# Patient Record
Sex: Female | Born: 1935
Health system: Southern US, Community
[De-identification: ages and names within clinical notes are randomized; demographics above are authoritative.]

## PROBLEM LIST (undated history)

## (undated) DIAGNOSIS — F0390 Unspecified dementia without behavioral disturbance: Secondary | ICD-10-CM

## (undated) DIAGNOSIS — E039 Hypothyroidism, unspecified: Secondary | ICD-10-CM

## (undated) HISTORY — PX: CHOLECYSTECTOMY: SHX55

## (undated) HISTORY — PX: ABDOMINAL HYSTERECTOMY: SHX81

---

## 2000-12-12 ENCOUNTER — Encounter: Admission: RE | Admit: 2000-12-12 | Discharge: 2000-12-12 | Payer: Self-pay | Admitting: Otolaryngology

## 2000-12-12 ENCOUNTER — Encounter: Payer: Self-pay | Admitting: Otolaryngology

## 2000-12-14 ENCOUNTER — Other Ambulatory Visit: Admission: RE | Admit: 2000-12-14 | Discharge: 2000-12-14 | Payer: Self-pay | Admitting: Otolaryngology

## 2001-05-27 ENCOUNTER — Encounter: Admission: RE | Admit: 2001-05-27 | Discharge: 2001-05-27 | Payer: Self-pay | Admitting: Otolaryngology

## 2001-05-27 ENCOUNTER — Encounter: Payer: Self-pay | Admitting: Otolaryngology

## 2002-12-11 ENCOUNTER — Encounter: Payer: Self-pay | Admitting: Otolaryngology

## 2002-12-11 ENCOUNTER — Ambulatory Visit (HOSPITAL_COMMUNITY): Admission: RE | Admit: 2002-12-11 | Discharge: 2002-12-11 | Payer: Self-pay | Admitting: Otolaryngology

## 2002-12-25 ENCOUNTER — Encounter (HOSPITAL_COMMUNITY): Admission: RE | Admit: 2002-12-25 | Discharge: 2003-01-24 | Payer: Self-pay | Admitting: Family Medicine

## 2002-12-26 ENCOUNTER — Encounter: Payer: Self-pay | Admitting: Family Medicine

## 2004-06-27 ENCOUNTER — Other Ambulatory Visit: Admission: RE | Admit: 2004-06-27 | Discharge: 2004-06-27 | Payer: Self-pay | Admitting: Internal Medicine

## 2005-01-21 ENCOUNTER — Emergency Department (HOSPITAL_COMMUNITY): Admission: EM | Admit: 2005-01-21 | Discharge: 2005-01-21 | Payer: Self-pay | Admitting: Emergency Medicine

## 2012-03-07 DIAGNOSIS — E039 Hypothyroidism, unspecified: Secondary | ICD-10-CM | POA: Diagnosis not present

## 2012-03-07 DIAGNOSIS — J209 Acute bronchitis, unspecified: Secondary | ICD-10-CM | POA: Diagnosis not present

## 2012-08-30 ENCOUNTER — Other Ambulatory Visit (HOSPITAL_COMMUNITY): Payer: Self-pay | Admitting: Internal Medicine

## 2012-08-30 DIAGNOSIS — J9801 Acute bronchospasm: Secondary | ICD-10-CM | POA: Diagnosis not present

## 2012-08-30 DIAGNOSIS — Z0000 Encounter for general adult medical examination without abnormal findings: Secondary | ICD-10-CM

## 2012-09-05 ENCOUNTER — Other Ambulatory Visit (HOSPITAL_COMMUNITY): Payer: Self-pay

## 2013-08-29 DIAGNOSIS — J310 Chronic rhinitis: Secondary | ICD-10-CM | POA: Diagnosis not present

## 2013-08-29 DIAGNOSIS — J019 Acute sinusitis, unspecified: Secondary | ICD-10-CM | POA: Diagnosis not present

## 2013-09-18 DIAGNOSIS — H18519 Endothelial corneal dystrophy, unspecified eye: Secondary | ICD-10-CM | POA: Diagnosis not present

## 2013-09-18 DIAGNOSIS — H35369 Drusen (degenerative) of macula, unspecified eye: Secondary | ICD-10-CM | POA: Diagnosis not present

## 2013-09-18 DIAGNOSIS — H524 Presbyopia: Secondary | ICD-10-CM | POA: Diagnosis not present

## 2013-09-18 DIAGNOSIS — H35039 Hypertensive retinopathy, unspecified eye: Secondary | ICD-10-CM | POA: Diagnosis not present

## 2013-09-18 DIAGNOSIS — H2510 Age-related nuclear cataract, unspecified eye: Secondary | ICD-10-CM | POA: Diagnosis not present

## 2013-10-07 DIAGNOSIS — H269 Unspecified cataract: Secondary | ICD-10-CM | POA: Diagnosis not present

## 2013-10-07 DIAGNOSIS — H2510 Age-related nuclear cataract, unspecified eye: Secondary | ICD-10-CM | POA: Diagnosis not present

## 2013-11-12 DIAGNOSIS — H25019 Cortical age-related cataract, unspecified eye: Secondary | ICD-10-CM | POA: Diagnosis not present

## 2013-11-12 DIAGNOSIS — H2510 Age-related nuclear cataract, unspecified eye: Secondary | ICD-10-CM | POA: Diagnosis not present

## 2013-11-25 DIAGNOSIS — H2510 Age-related nuclear cataract, unspecified eye: Secondary | ICD-10-CM | POA: Diagnosis not present

## 2013-11-25 DIAGNOSIS — H269 Unspecified cataract: Secondary | ICD-10-CM | POA: Diagnosis not present

## 2014-01-06 DIAGNOSIS — E040 Nontoxic diffuse goiter: Secondary | ICD-10-CM | POA: Diagnosis not present

## 2014-05-05 DIAGNOSIS — E040 Nontoxic diffuse goiter: Secondary | ICD-10-CM | POA: Diagnosis not present

## 2014-05-21 DIAGNOSIS — B354 Tinea corporis: Secondary | ICD-10-CM | POA: Diagnosis not present

## 2014-05-21 DIAGNOSIS — IMO0002 Reserved for concepts with insufficient information to code with codable children: Secondary | ICD-10-CM | POA: Diagnosis not present

## 2014-08-25 DIAGNOSIS — E039 Hypothyroidism, unspecified: Secondary | ICD-10-CM | POA: Diagnosis not present

## 2014-08-25 DIAGNOSIS — E049 Nontoxic goiter, unspecified: Secondary | ICD-10-CM | POA: Diagnosis not present

## 2014-08-26 DIAGNOSIS — Z961 Presence of intraocular lens: Secondary | ICD-10-CM | POA: Diagnosis not present

## 2014-10-15 DIAGNOSIS — B354 Tinea corporis: Secondary | ICD-10-CM | POA: Diagnosis not present

## 2014-10-15 DIAGNOSIS — Z6823 Body mass index (BMI) 23.0-23.9, adult: Secondary | ICD-10-CM | POA: Diagnosis not present

## 2014-11-06 DIAGNOSIS — Z6823 Body mass index (BMI) 23.0-23.9, adult: Secondary | ICD-10-CM | POA: Diagnosis not present

## 2014-11-06 DIAGNOSIS — B354 Tinea corporis: Secondary | ICD-10-CM | POA: Diagnosis not present

## 2014-12-22 DIAGNOSIS — E049 Nontoxic goiter, unspecified: Secondary | ICD-10-CM | POA: Diagnosis not present

## 2015-01-25 DIAGNOSIS — Z6823 Body mass index (BMI) 23.0-23.9, adult: Secondary | ICD-10-CM | POA: Diagnosis not present

## 2015-01-25 DIAGNOSIS — E039 Hypothyroidism, unspecified: Secondary | ICD-10-CM | POA: Diagnosis not present

## 2015-01-25 DIAGNOSIS — L259 Unspecified contact dermatitis, unspecified cause: Secondary | ICD-10-CM | POA: Diagnosis not present

## 2015-02-01 ENCOUNTER — Other Ambulatory Visit (HOSPITAL_COMMUNITY): Payer: Self-pay | Admitting: Internal Medicine

## 2015-02-01 DIAGNOSIS — E049 Nontoxic goiter, unspecified: Secondary | ICD-10-CM

## 2015-02-05 ENCOUNTER — Ambulatory Visit (HOSPITAL_COMMUNITY)
Admission: RE | Admit: 2015-02-05 | Discharge: 2015-02-05 | Disposition: A | Discharge status: Home / Self Care | Payer: Medicare Other | Source: Ambulatory Visit | Attending: Internal Medicine | Admitting: Internal Medicine

## 2015-02-05 DIAGNOSIS — E042 Nontoxic multinodular goiter: Secondary | ICD-10-CM | POA: Diagnosis not present

## 2015-02-05 DIAGNOSIS — E049 Nontoxic goiter, unspecified: Secondary | ICD-10-CM | POA: Diagnosis not present

## 2015-03-23 DIAGNOSIS — E049 Nontoxic goiter, unspecified: Secondary | ICD-10-CM | POA: Diagnosis not present

## 2015-03-23 DIAGNOSIS — E039 Hypothyroidism, unspecified: Secondary | ICD-10-CM | POA: Diagnosis not present

## 2015-03-25 DIAGNOSIS — L304 Erythema intertrigo: Secondary | ICD-10-CM | POA: Diagnosis not present

## 2015-05-10 DIAGNOSIS — E049 Nontoxic goiter, unspecified: Secondary | ICD-10-CM | POA: Diagnosis not present

## 2015-05-10 DIAGNOSIS — E032 Hypothyroidism due to medicaments and other exogenous substances: Secondary | ICD-10-CM | POA: Diagnosis not present

## 2015-06-22 DIAGNOSIS — E039 Hypothyroidism, unspecified: Secondary | ICD-10-CM | POA: Diagnosis not present

## 2015-06-22 DIAGNOSIS — E032 Hypothyroidism due to medicaments and other exogenous substances: Secondary | ICD-10-CM | POA: Diagnosis not present

## 2015-10-12 DIAGNOSIS — E049 Nontoxic goiter, unspecified: Secondary | ICD-10-CM | POA: Diagnosis not present

## 2015-10-13 DIAGNOSIS — H16141 Punctate keratitis, right eye: Secondary | ICD-10-CM | POA: Diagnosis not present

## 2016-01-11 DIAGNOSIS — E049 Nontoxic goiter, unspecified: Secondary | ICD-10-CM | POA: Diagnosis not present

## 2016-01-11 DIAGNOSIS — E032 Hypothyroidism due to medicaments and other exogenous substances: Secondary | ICD-10-CM | POA: Diagnosis not present

## 2016-02-17 DIAGNOSIS — Z961 Presence of intraocular lens: Secondary | ICD-10-CM | POA: Diagnosis not present

## 2016-02-29 DIAGNOSIS — R070 Pain in throat: Secondary | ICD-10-CM | POA: Diagnosis not present

## 2016-02-29 DIAGNOSIS — Z6824 Body mass index (BMI) 24.0-24.9, adult: Secondary | ICD-10-CM | POA: Diagnosis not present

## 2016-02-29 DIAGNOSIS — Z1389 Encounter for screening for other disorder: Secondary | ICD-10-CM | POA: Diagnosis not present

## 2016-02-29 DIAGNOSIS — J069 Acute upper respiratory infection, unspecified: Secondary | ICD-10-CM | POA: Diagnosis not present

## 2016-02-29 DIAGNOSIS — J343 Hypertrophy of nasal turbinates: Secondary | ICD-10-CM | POA: Diagnosis not present

## 2016-02-29 DIAGNOSIS — J209 Acute bronchitis, unspecified: Secondary | ICD-10-CM | POA: Diagnosis not present

## 2016-04-11 DIAGNOSIS — E032 Hypothyroidism due to medicaments and other exogenous substances: Secondary | ICD-10-CM | POA: Diagnosis not present

## 2016-04-11 DIAGNOSIS — E039 Hypothyroidism, unspecified: Secondary | ICD-10-CM | POA: Diagnosis not present

## 2016-07-25 DIAGNOSIS — R5383 Other fatigue: Secondary | ICD-10-CM | POA: Diagnosis not present

## 2016-07-25 DIAGNOSIS — E538 Deficiency of other specified B group vitamins: Secondary | ICD-10-CM | POA: Diagnosis not present

## 2016-07-25 DIAGNOSIS — E049 Nontoxic goiter, unspecified: Secondary | ICD-10-CM | POA: Diagnosis not present

## 2016-07-25 DIAGNOSIS — E032 Hypothyroidism due to medicaments and other exogenous substances: Secondary | ICD-10-CM | POA: Diagnosis not present

## 2016-10-31 DIAGNOSIS — E049 Nontoxic goiter, unspecified: Secondary | ICD-10-CM | POA: Diagnosis not present

## 2017-02-06 DIAGNOSIS — E049 Nontoxic goiter, unspecified: Secondary | ICD-10-CM | POA: Diagnosis not present

## 2017-05-15 DIAGNOSIS — E049 Nontoxic goiter, unspecified: Secondary | ICD-10-CM | POA: Diagnosis not present

## 2017-05-15 DIAGNOSIS — E039 Hypothyroidism, unspecified: Secondary | ICD-10-CM | POA: Diagnosis not present

## 2017-10-01 DIAGNOSIS — E040 Nontoxic diffuse goiter: Secondary | ICD-10-CM | POA: Diagnosis not present

## 2017-10-01 DIAGNOSIS — E039 Hypothyroidism, unspecified: Secondary | ICD-10-CM | POA: Diagnosis not present

## 2018-01-31 DIAGNOSIS — E785 Hyperlipidemia, unspecified: Secondary | ICD-10-CM | POA: Diagnosis not present

## 2018-01-31 DIAGNOSIS — R5383 Other fatigue: Secondary | ICD-10-CM | POA: Diagnosis not present

## 2018-01-31 DIAGNOSIS — E559 Vitamin D deficiency, unspecified: Secondary | ICD-10-CM | POA: Diagnosis not present

## 2018-01-31 DIAGNOSIS — E049 Nontoxic goiter, unspecified: Secondary | ICD-10-CM | POA: Diagnosis not present

## 2018-03-25 DIAGNOSIS — R5383 Other fatigue: Secondary | ICD-10-CM | POA: Diagnosis not present

## 2018-03-25 DIAGNOSIS — E049 Nontoxic goiter, unspecified: Secondary | ICD-10-CM | POA: Diagnosis not present

## 2018-03-25 DIAGNOSIS — E785 Hyperlipidemia, unspecified: Secondary | ICD-10-CM | POA: Diagnosis not present

## 2018-03-25 DIAGNOSIS — E559 Vitamin D deficiency, unspecified: Secondary | ICD-10-CM | POA: Diagnosis not present

## 2018-10-15 ENCOUNTER — Encounter (HOSPITAL_COMMUNITY): Payer: Self-pay

## 2018-10-15 ENCOUNTER — Emergency Department (HOSPITAL_COMMUNITY)
Admission: EM | Admit: 2018-10-15 | Discharge: 2018-10-16 | Disposition: A | Discharge status: Home / Self Care | Payer: Medicare Other | Attending: Emergency Medicine | Admitting: Emergency Medicine

## 2018-10-15 ENCOUNTER — Other Ambulatory Visit: Payer: Self-pay

## 2018-10-15 DIAGNOSIS — K5730 Diverticulosis of large intestine without perforation or abscess without bleeding: Secondary | ICD-10-CM | POA: Insufficient documentation

## 2018-10-15 DIAGNOSIS — R319 Hematuria, unspecified: Secondary | ICD-10-CM | POA: Diagnosis not present

## 2018-10-15 DIAGNOSIS — M545 Low back pain, unspecified: Secondary | ICD-10-CM

## 2018-10-15 DIAGNOSIS — E049 Nontoxic goiter, unspecified: Secondary | ICD-10-CM | POA: Diagnosis not present

## 2018-10-15 DIAGNOSIS — Z79899 Other long term (current) drug therapy: Secondary | ICD-10-CM | POA: Diagnosis not present

## 2018-10-15 DIAGNOSIS — N390 Urinary tract infection, site not specified: Secondary | ICD-10-CM

## 2018-10-15 DIAGNOSIS — M549 Dorsalgia, unspecified: Secondary | ICD-10-CM | POA: Diagnosis not present

## 2018-10-15 DIAGNOSIS — E039 Hypothyroidism, unspecified: Secondary | ICD-10-CM | POA: Diagnosis not present

## 2018-10-15 DIAGNOSIS — K760 Fatty (change of) liver, not elsewhere classified: Secondary | ICD-10-CM | POA: Diagnosis not present

## 2018-10-15 HISTORY — DX: Hypothyroidism, unspecified: E03.9

## 2018-10-15 NOTE — ED Provider Notes (Signed)
[facility] EMERGENCY DEPARTMENT Provider Note   CSN: [id number] Arrival date & time: 10/15/18  1901     History   Chief Complaint Chief Complaint  Patient presents with  . Back Pain    vomit x 3    HPI Laura Young is a 84 y.o. female.  Patient presents with bilateral paraspinal back pain intermittently for the past 2 days.  Reports started yesterday morning without fall or trauma.  No history of back problems.  She took aspirin at home today with partial relief.  States the pain is worse when she changes position and tries to move.  There is no radiation of the pain down her legs.  She had several episodes of vomiting this afternoon that was nonbloody and nonbilious.  No fevers.  No pain with urination or blood in the urine.  No vaginal bleeding or discharge.  No chest pain or shortness of breath.  No bowel or bladder incontinence.  No focal weakness in her legs, numbness or tingling.  She is never had this kind of pain before and has no history of back problems. Only medical history is hypothyroidism.  Previous hysterectomy and cholecystectomy.  The history is provided by the patient and a relative.  Back Pain  Associated symptoms: no abdominal pain, no chest pain, no dysuria, no headaches and no weakness     Past Medical History:  Diagnosis Date  . Hypothyroidism    goiter    There are no active problems to display for this patient.   Past Surgical History:  Procedure Laterality Date  . ABDOMINAL HYSTERECTOMY    . CHOLECYSTECTOMY       OB History   No obstetric history on file.      Home Medications    Prior to Admission medications   Medication Sig Start Date End Date Taking? Authorizing Provider  levothyroxine (SYNTHROID, LEVOTHROID) 50 MCG tablet Take 50 mcg by mouth daily before breakfast.   Yes [provider]    Family History No family history on file.  Social History Social History   Tobacco Use  . Smoking status: Never Smoker  .  Smokeless tobacco: Never Used  Substance Use Topics  . Alcohol use: Never    Frequency: Never  . Drug use: Never     Allergies   Patient has no known allergies.   Review of Systems Review of Systems  Constitutional: Negative for activity change and appetite change.  HENT: Negative for congestion and rhinorrhea.   Respiratory: Negative for cough, chest tightness and shortness of breath.   Cardiovascular: Negative for chest pain.  Gastrointestinal: Positive for nausea and vomiting. Negative for abdominal pain.  Genitourinary: Negative for dysuria, hematuria, vaginal bleeding and vaginal discharge.  Musculoskeletal: Positive for arthralgias, back pain and myalgias.  Skin: Negative for rash.  Neurological: Negative for dizziness, weakness and headaches.   all other systems are negative except as noted in the HPI and PMH.     Physical Exam Updated Vital Signs BP (!) 157/59 (BP Location: Right Arm)   Pulse 79   Temp 97.9 F (36.6 C) (Oral)   Resp 16   Ht 5\' 2"  (1.575 m)   Wt 53.1 kg   SpO2 99%   BMI 21.40 kg/m   Physical Exam Vitals signs and nursing note reviewed.  Constitutional:      General: She is not in acute distress.    Appearance: She is well-developed.  HENT:     Head: Normocephalic and atraumatic.  Mouth/Throat:     Pharynx: No oropharyngeal exudate.  Eyes:     Conjunctiva/sclera: Conjunctivae normal.     Pupils: Pupils are equal, round, and reactive to light.  Neck:     Musculoskeletal: Normal range of motion and neck supple.     Comments: No meningismus. Enlarged thyroid Cardiovascular:     Rate and Rhythm: Normal rate and regular rhythm.     Pulses: Normal pulses.     Heart sounds: Normal heart sounds. No murmur.  Pulmonary:     Effort: Pulmonary effort is normal. No respiratory distress.     Breath sounds: Normal breath sounds.  Abdominal:     Palpations: Abdomen is soft.     Tenderness: There is no abdominal tenderness. There is no  guarding or rebound.     Comments: Equal femoral pulses bilaterally  Abdomen soft and nontender  Musculoskeletal: Normal range of motion.        General: Tenderness present.     Comments: Paraspinal lumbar tenderness bilaterally  5/5 strength in bilateral lower extremities. Ankle plantar and dorsiflexion intact. Great toe extension intact bilaterally. +2 DP and PT pulses. +2 patellar reflexes bilaterally. Normal gait.   Skin:    General: Skin is warm.     Capillary Refill: Capillary refill takes less than 2 seconds.  Neurological:     General: No focal deficit present.     Mental Status: She is alert and oriented to person, place, and time. Mental status is at baseline.     Cranial Nerves: No cranial nerve deficit.     Motor: No abnormal muscle tone.     Coordination: Coordination normal.     Comments: No ataxia on finger to nose bilaterally. No pronator drift. 5/5 strength throughout. CN 2-12 intact.Equal grip strength. Sensation intact.   Psychiatric:        Behavior: Behavior normal.      ED Treatments / Results  Labs (all labs ordered are listed, but only abnormal results are displayed) Labs Reviewed  URINALYSIS, ROUTINE W REFLEX MICROSCOPIC - Abnormal; Notable for the following components:      Result Value   Hgb urine dipstick SMALL (*)    Ketones, ur 5 (*)    Leukocytes, UA LARGE (*)    All other components within normal limits  COMPREHENSIVE METABOLIC PANEL - Abnormal; Notable for the following components:   Potassium 3.4 (*)    Glucose, Bld 113 (*)    Calcium 8.4 (*)    AST 13 (*)    All other components within normal limits  URINE CULTURE  CBC WITH DIFFERENTIAL/PLATELET  LIPASE, BLOOD  TROPONIN I  LACTIC ACID, PLASMA  I-STAT TROPONIN, ED    EKG EKG Interpretation  Date/Time:  Tuesday October 15 2018 23:38:39 EST Ventricular Rate:  69 PR Interval:    QRS Duration: 90 QT Interval:  424 QTC Calculation: 455 R Axis:   1 Text Interpretation:  Sinus  rhythm Low voltage, precordial leads No previous ECGs available Confirmed by Glynn Octave [phone]) on 10/15/2018 11:44:44 PM   Radiology Ct Angio Chest/abd/pel For Dissection W And/or Wo Contrast  Result Date: 10/16/2018 CLINICAL DATA:  Bilateral low back pain for 4 days. EXAM: CT ANGIOGRAPHY CHEST, ABDOMEN AND PELVIS TECHNIQUE: Multidetector CT imaging through the chest, abdomen and pelvis was performed using the standard protocol during bolus administration of intravenous contrast. Multiplanar reconstructed images and MIPs were obtained and reviewed to evaluate the vascular anatomy. CONTRAST:  ISOVUE-370 IOPAMIDOL (ISOVUE-370) INJECTION 76% COMPARISON:  None. FINDINGS: CTA CHEST FINDINGS Cardiovascular: Noncontrast CT images of the chest demonstrate scattered aortic calcifications. No evidence of intramural hematoma. Images obtained during arterial phase after administration of intravenous contrast material demonstrate normal caliber and patent thoracic aorta. No aneurysm. No dissection. Great vessel origins are patent. Central pulmonary arteries are well opacified. No significant pulmonary embolus. Cardiac enlargement. No pericardial effusions. Mediastinum/Nodes: There is massive enlargement of the thyroid gland with bilateral thyroid enlargement although the right thyroid is asymmetrically more prominent, measuring 6.1 x 6 point 9 x 4.6 cm in diameter. This was previously evaluated at ultrasound on 02/05/2015. Biopsy was recommended at that time. If interval biopsy was not done, repeat ultrasound would be suggested. Esophagus is decompressed. No significant lymphadenopathy in the chest. Lungs/Pleura: Motion artifact limits examination. There is suggestion of dependent atelectasis in the lung bases. This could also represent motion artifact. No focal consolidation or airspace disease. No pleural effusions. No pneumothorax. Airways are patent. Musculoskeletal: No chest wall abnormality. No acute or  significant osseous findings. Review of the MIP images confirms the above findings. CTA ABDOMEN AND PELVIS FINDINGS VASCULAR Aorta: Normal caliber aorta without aneurysm, dissection, vasculitis or significant stenosis. Scattered aortic calcifications. Celiac: Patent without evidence of aneurysm, dissection, vasculitis or significant stenosis. SMA: Patent without evidence of aneurysm, dissection, vasculitis or significant stenosis. Renals: Both renal arteries are patent without evidence of aneurysm, dissection, vasculitis, fibromuscular dysplasia or significant stenosis. IMA: Patent without evidence of aneurysm, dissection, vasculitis or significant stenosis. Inflow: Patent without evidence of aneurysm, dissection, vasculitis or significant stenosis. Veins: No obvious venous abnormality within the limitations of this arterial phase study. Review of the MIP images confirms the above findings. NON-VASCULAR Hepatobiliary: Diffuse fatty infiltration of the liver. No focal liver lesions. Surgical absence of the gallbladder. No bile duct dilatation. Pancreas: Unremarkable. No pancreatic ductal dilatation or surrounding inflammatory changes. Spleen: Normal in size without focal abnormality. Adrenals/Urinary Tract: Adrenal glands are unremarkable. Kidneys are normal, without renal calculi, focal lesion, or hydronephrosis. Bladder is unremarkable. Stomach/Bowel: Stomach, small bowel, and colon are mostly decompressed although there is scattered stool throughout the colon. Diverticulosis of the sigmoid colon without evidence of diverticulitis. Appendix is not identified. Lymphatic: No significant lymphadenopathy. Reproductive: Status post hysterectomy. No adnexal masses. Other: No abdominal wall hernia or abnormality. No abdominopelvic ascites. Musculoskeletal: No acute or significant osseous findings. Review of the MIP images confirms the above findings. IMPRESSION: 1. No evidence of aneurysm or dissection in the thoracic or  abdominal aorta. No evidence of significant pulmonary embolus. 2. Massive enlargement of the thyroid gland with asymmetric enlargement of the right thyroid lobe. Biopsy was recommended at a previous ultrasound from 02/05/2015. If interval biopsy was not done, repeat ultrasound would be suggested. 3. Diffuse fatty infiltration of the liver. 4. Diverticulosis of the colon without evidence of diverticulitis. Aortic Atherosclerosis (ICD10-I70.0). Electronically Signed   By: Burman Nieves M.D.   On: 10/16/2018 02:35    Procedures Procedures (including critical care time)  Medications Ordered in ED Medications - No data to display   Initial Impression / Assessment and Plan / ED Course  I have reviewed the triage vital signs and the nursing notes.  Pertinent labs & imaging results that were available during my care of the patient were reviewed by me and considered in my medical decision making (see chart for details).    Low back pain with nausea and vomiting.  Neurovascularly intact with intact pulses, reflexes, strength and sensation  Low suspicion for cord compression  or cauda equina.  Urinalysis shows leukocytes with white blood cells and red blood cells.  We will send culture.  CT scan obtained shows no evidence of urinary tract calculi or other acute pathology.  No AAA.  No aortic dissection or pulmonary embolism.  Thyroid enlargement discussed with patient and daughter.  She has a known goiter and is on Synthroid.  Daughter thinks the last biopsy was done about 2008.  [place]ocrinologist in [place] Dr. Dario GuardianJadali.  She also sees Dr. Phillips OdorGolding locally in [place].  Patient and daughter deny any change in the size of the goiter recently and denies any difficulty breathing or difficulty swallowing.  Discussed with patient and daughter that they should follow-up with her PCP and endocrinologist for consideration of repeat biopsy.  Patient's back pain has resolved.  She is tolerating p.o.  without vomiting.  Suspect UTI contributing.  Culture sent.  No evidence of kidney stone.  Denies any abdominal pain, chest pain or shortness of breath. Troponin negative x2.  States back has improved and declines any type of pain medication.  Treat for suspected UTI. No evidence of ACS, PE, aortic dissection, AAA. No evidence of cord compression or cauda equina. Followup with PCP as well as endocrinology regarding need for thyroid biopsy. Return precautions discussed.    Final Clinical Impressions(s) / ED Diagnoses   Final diagnoses:  Acute bilateral low back pain without sciatica  Urinary tract infection with hematuria, site unspecified  Enlarged thyroid    ED Discharge Orders    None       Yolonda Purtle, Jeannett SeniorStephen, MD 10/16/18 [phone]

## 2018-10-15 NOTE — ED Triage Notes (Signed)
Pt reports bilateral lower back pain x 4 days off and on. Pt reports vomiting 3 times today starting at noon. Last time vomited was 5:30pm. Pt alert and oriented x 4.

## 2018-10-16 ENCOUNTER — Emergency Department (HOSPITAL_COMMUNITY): Payer: Medicare Other

## 2018-10-16 DIAGNOSIS — N390 Urinary tract infection, site not specified: Secondary | ICD-10-CM | POA: Diagnosis not present

## 2018-10-16 DIAGNOSIS — M549 Dorsalgia, unspecified: Secondary | ICD-10-CM | POA: Diagnosis not present

## 2018-10-16 DIAGNOSIS — K5730 Diverticulosis of large intestine without perforation or abscess without bleeding: Secondary | ICD-10-CM | POA: Diagnosis not present

## 2018-10-16 DIAGNOSIS — K760 Fatty (change of) liver, not elsewhere classified: Secondary | ICD-10-CM | POA: Diagnosis not present

## 2018-10-16 LAB — URINALYSIS, ROUTINE W REFLEX MICROSCOPIC
Bacteria, UA: NONE SEEN
Bilirubin Urine: NEGATIVE
Glucose, UA: NEGATIVE mg/dL
Ketones, ur: 5 mg/dL — AB
Nitrite: NEGATIVE
PH: 5 (ref 5.0–8.0)
Protein, ur: NEGATIVE mg/dL
Specific Gravity, Urine: 1.013 (ref 1.005–1.030)

## 2018-10-16 LAB — CBC WITH DIFFERENTIAL/PLATELET
Abs Immature Granulocytes: 0.02 10*3/uL (ref 0.00–0.07)
BASOS ABS: 0.1 10*3/uL (ref 0.0–0.1)
Basophils Relative: 1 %
Eosinophils Absolute: 0.1 10*3/uL (ref 0.0–0.5)
Eosinophils Relative: 1 %
HCT: 42.4 % (ref 36.0–46.0)
Hemoglobin: 13.4 g/dL (ref 12.0–15.0)
Immature Granulocytes: 0 %
Lymphocytes Relative: 20 %
Lymphs Abs: 1.9 10*3/uL (ref 0.7–4.0)
MCH: 28.5 pg (ref 26.0–34.0)
MCHC: 31.6 g/dL (ref 30.0–36.0)
MCV: 90.2 fL (ref 80.0–100.0)
Monocytes Absolute: 0.9 10*3/uL (ref 0.1–1.0)
Monocytes Relative: 9 %
NRBC: 0 % (ref 0.0–0.2)
Neutro Abs: 6.5 10*3/uL (ref 1.7–7.7)
Neutrophils Relative %: 69 %
Platelets: 246 10*3/uL (ref 150–400)
RBC: 4.7 MIL/uL (ref 3.87–5.11)
RDW: 13.2 % (ref 11.5–15.5)
WBC: 9.5 10*3/uL (ref 4.0–10.5)

## 2018-10-16 LAB — TROPONIN I: Troponin I: <0.03 ng/mL (ref <0.03)

## 2018-10-16 LAB — COMPREHENSIVE METABOLIC PANEL
ALT: 13 U/L (ref 0–44)
AST: 13 U/L — ABNORMAL LOW (ref 15–41)
Albumin: 3.6 g/dL (ref 3.5–5.0)
Alkaline Phosphatase: 57 U/L (ref 38–126)
Anion gap: 8 (ref 5–15)
BUN: 17 mg/dL (ref 8–23)
CO2: 24 mmol/L (ref 22–32)
Calcium: 8.4 mg/dL — ABNORMAL LOW (ref 8.9–10.3)
Chloride: 103 mmol/L (ref 98–111)
Creatinine, Ser: 0.76 mg/dL (ref 0.44–1.00)
GFR calc Af Amer: >60 mL/min (ref >60)
GFR calc non Af Amer: >60 mL/min (ref >60)
Glucose, Bld: 113 mg/dL — ABNORMAL HIGH (ref 70–99)
POTASSIUM: 3.4 mmol/L — AB (ref 3.5–5.1)
Sodium: 135 mmol/L (ref 135–145)
Total Bilirubin: 0.8 mg/dL (ref 0.3–1.2)
Total Protein: 6.6 g/dL (ref 6.5–8.1)

## 2018-10-16 LAB — I-STAT TROPONIN, ED: TROPONIN I, POC: 0 ng/mL (ref 0.00–0.08)

## 2018-10-16 LAB — LACTIC ACID, PLASMA: Lactic Acid, Venous: 0.7 mmol/L (ref 0.5–1.9)

## 2018-10-16 LAB — LIPASE, BLOOD: Lipase: 32 U/L (ref 11–51)

## 2018-10-16 MED ORDER — IOPAMIDOL (ISOVUE-370) INJECTION 76%
100.0000 mL | Freq: Once | INTRAVENOUS | Status: AC | PRN
  Administered 2018-10-16: 100 mL via INTRAVENOUS

## 2018-10-16 MED ORDER — CEPHALEXIN 500 MG PO CAPS: 500.0000 mg | ORAL_CAPSULE | Freq: Two times a day (BID) | ORAL | 0 refills | Status: DC

## 2018-10-16 MED ORDER — CEPHALEXIN 500 MG PO CAPS
500.0000 mg | ORAL_CAPSULE | Freq: Once | ORAL | Status: AC
  Administered 2018-10-16: 500 mg via ORAL
  Filled 2018-10-16: qty 1

## 2018-10-16 MED ORDER — ONDANSETRON 4 MG PO TBDP: 4.0000 mg | ORAL_TABLET | Freq: Three times a day (TID) | ORAL | 0 refills | Status: DC | PRN

## 2018-10-16 NOTE — Discharge Instructions (Signed)
Your testing is reassuring.  Take the antibiotic for a possible urinary tract infection and follow-up with Dr. Phillips Odor.  Your CT scan today showed an enlarged thyroid and you may need to have another biopsy to make sure this is nothing cancerous.  You could see your endocrinologist in [place] or see Dr. Fransico Him in [place].  Return to the ED with worsening pain, fever, vomiting, any other concerns.

## 2018-10-17 LAB — URINE CULTURE: Culture: NO GROWTH

## 2018-10-25 DIAGNOSIS — Z1389 Encounter for screening for other disorder: Secondary | ICD-10-CM | POA: Diagnosis not present

## 2018-10-25 DIAGNOSIS — E039 Hypothyroidism, unspecified: Secondary | ICD-10-CM | POA: Diagnosis not present

## 2018-10-25 DIAGNOSIS — Z6822 Body mass index (BMI) 22.0-22.9, adult: Secondary | ICD-10-CM | POA: Diagnosis not present

## 2018-10-25 DIAGNOSIS — Z0001 Encounter for general adult medical examination with abnormal findings: Secondary | ICD-10-CM | POA: Diagnosis not present

## 2018-10-25 DIAGNOSIS — E049 Nontoxic goiter, unspecified: Secondary | ICD-10-CM | POA: Diagnosis not present

## 2018-12-06 DIAGNOSIS — E039 Hypothyroidism, unspecified: Secondary | ICD-10-CM | POA: Diagnosis not present

## 2018-12-06 DIAGNOSIS — E049 Nontoxic goiter, unspecified: Secondary | ICD-10-CM | POA: Diagnosis not present

## 2019-05-21 DIAGNOSIS — Z6820 Body mass index (BMI) 20.0-20.9, adult: Secondary | ICD-10-CM | POA: Diagnosis not present

## 2019-05-21 DIAGNOSIS — R413 Other amnesia: Secondary | ICD-10-CM | POA: Diagnosis not present

## 2019-05-21 DIAGNOSIS — E049 Nontoxic goiter, unspecified: Secondary | ICD-10-CM | POA: Diagnosis not present

## 2019-09-22 ENCOUNTER — Emergency Department (HOSPITAL_COMMUNITY)
Admission: EM | Admit: 2019-09-22 | Discharge: 2019-09-23 | Disposition: A | Discharge status: Home / Self Care | Payer: Medicare Other | Attending: Emergency Medicine | Admitting: Emergency Medicine

## 2019-09-22 ENCOUNTER — Other Ambulatory Visit: Payer: Self-pay

## 2019-09-22 ENCOUNTER — Encounter (HOSPITAL_COMMUNITY): Payer: Self-pay | Admitting: Emergency Medicine

## 2019-09-22 DIAGNOSIS — R112 Nausea with vomiting, unspecified: Secondary | ICD-10-CM

## 2019-09-22 DIAGNOSIS — Z79899 Other long term (current) drug therapy: Secondary | ICD-10-CM | POA: Insufficient documentation

## 2019-09-22 DIAGNOSIS — E039 Hypothyroidism, unspecified: Secondary | ICD-10-CM | POA: Insufficient documentation

## 2019-09-22 DIAGNOSIS — U071 COVID-19: Secondary | ICD-10-CM | POA: Insufficient documentation

## 2019-09-22 DIAGNOSIS — Z20822 Contact with and (suspected) exposure to covid-19: Secondary | ICD-10-CM

## 2019-09-22 LAB — CBC WITH DIFFERENTIAL/PLATELET
Abs Immature Granulocytes: 0.03 10*3/uL (ref 0.00–0.07)
Basophils Absolute: 0.1 10*3/uL (ref 0.0–0.1)
Basophils Relative: 0 %
Eosinophils Absolute: 0 10*3/uL (ref 0.0–0.5)
Eosinophils Relative: 0 %
HCT: 44.9 % (ref 36.0–46.0)
Hemoglobin: 14.4 g/dL (ref 12.0–15.0)
Immature Granulocytes: 0 %
Lymphocytes Relative: 6 %
Lymphs Abs: 0.9 10*3/uL (ref 0.7–4.0)
MCH: 29.4 pg (ref 26.0–34.0)
MCHC: 32.1 g/dL (ref 30.0–36.0)
MCV: 91.8 fL (ref 80.0–100.0)
Monocytes Absolute: 0.3 10*3/uL (ref 0.1–1.0)
Monocytes Relative: 2 %
Neutro Abs: 12.3 10*3/uL — ABNORMAL HIGH (ref 1.7–7.7)
Neutrophils Relative %: 92 %
Platelets: 242 10*3/uL (ref 150–400)
RBC: 4.89 MIL/uL (ref 3.87–5.11)
RDW: 12.4 % (ref 11.5–15.5)
WBC: 13.6 10*3/uL — ABNORMAL HIGH (ref 4.0–10.5)
nRBC: 0 % (ref 0.0–0.2)

## 2019-09-22 LAB — BASIC METABOLIC PANEL
Anion gap: 9 (ref 5–15)
BUN: 18 mg/dL (ref 8–23)
CO2: 27 mmol/L (ref 22–32)
Calcium: 8.7 mg/dL — ABNORMAL LOW (ref 8.9–10.3)
Chloride: 104 mmol/L (ref 98–111)
Creatinine, Ser: 0.86 mg/dL (ref 0.44–1.00)
GFR calc Af Amer: >60 mL/min (ref >60)
GFR calc non Af Amer: >60 mL/min (ref >60)
Glucose, Bld: 143 mg/dL — ABNORMAL HIGH (ref 70–99)
Potassium: 3.6 mmol/L (ref 3.5–5.1)
Sodium: 140 mmol/L (ref 135–145)

## 2019-09-22 MED ORDER — ONDANSETRON HCL 4 MG/2ML IJ SOLN
4.0000 mg | Freq: Once | INTRAMUSCULAR | Status: DC
  Filled 2019-09-22: qty 2

## 2019-09-22 MED ORDER — SODIUM CHLORIDE 0.9 % IV BOLUS
1000.0000 mL | Freq: Once | INTRAVENOUS | Status: AC
  Administered 2019-09-22: 23:00:00 1000 mL via INTRAVENOUS

## 2019-09-22 NOTE — ED Notes (Signed)
Pt did not need to void when she tried

## 2019-09-22 NOTE — ED Notes (Signed)
Pt refused zofran saying she was not sick to her stomach and did not need nausea  med

## 2019-09-22 NOTE — ED Triage Notes (Addendum)
Pt c/o vomiting that started today. Pt's family has covid. Pt is actively vomiting in triage.

## 2019-09-22 NOTE — ED Notes (Signed)
Pt has been isolated since 09/02/19 and not had any exposures to Covid.

## 2019-09-22 NOTE — ED Notes (Signed)
Pt sitting in chair.

## 2019-09-22 NOTE — ED Notes (Signed)
RN to room to start IV and obtain blood work. Pt yelled at RN stating that she hasn't seen the edp yet. Pt informed that this is to expedite results to help understand what is wrong. She yelled stating that there is nothing wrong with her that she feels fine. Pt repeated again that she refuses IV and blood work.

## 2019-09-22 NOTE — ED Provider Notes (Signed)
[facility] EMERGENCY DEPARTMENT Provider Note   CSN: [id number] Arrival date & time: 09/22/19  2103   History Chief Complaint  Patient presents with  . Emesis    AMSI GRIMLEY is a 85 y.o. female.  The history is provided by a relative and the patient. The history is limited by the condition of the patient (Dementia).  Emesis She has history of hypothyroidism and dementia and comes in because of vomiting.  She apparently had started vomiting today and had vomited several times in the waiting room.  Patient states that she actually feels fine and does not think that she needs to be here.  Family members have been diagnosed with COVID-19.  She denies abdominal pain, fever, chills.  There has been no diarrhea.  Past Medical History:  Diagnosis Date  . Hypothyroidism    goiter    There are no problems to display for this patient.   Past Surgical History:  Procedure Laterality Date  . ABDOMINAL HYSTERECTOMY    . CHOLECYSTECTOMY       OB History   No obstetric history on file.     No family history on file.  Social History   Tobacco Use  . Smoking status: Never Smoker  . Smokeless tobacco: Never Used  Substance Use Topics  . Alcohol use: Never  . Drug use: Never    Home Medications Prior to Admission medications   Medication Sig Start Date End Date Taking? Authorizing Provider  cephALEXin (KEFLEX) 500 MG capsule Take 1 capsule (500 mg total) by mouth 2 (two) times daily. 10/16/18   Rancour, Annie Main, MD  levothyroxine (SYNTHROID, LEVOTHROID) 50 MCG tablet Take 50 mcg by mouth daily before breakfast.    [provider]  ondansetron (ZOFRAN ODT) 4 MG disintegrating tablet Take 1 tablet (4 mg total) by mouth every 8 (eight) hours as needed for nausea or vomiting. 10/16/18   Ezequiel Essex, MD    Allergies    Patient has no known allergies.  Review of Systems   Review of Systems  Unable to perform ROS: Dementia  Gastrointestinal: Positive for vomiting.     Physical Exam Updated Vital Signs BP (!) 154/57   Pulse 74   Temp 97.7 F (36.5 C) (Oral)   Resp 18   Wt 53 kg   SpO2 100%   BMI 21.37 kg/m   Physical Exam Vitals and nursing note reviewed.   85 year old female, resting comfortably and in no acute distress. Vital signs are significant for elevated blood pressure. Oxygen saturation is 100%, which is normal. Head is normocephalic and atraumatic. PERRLA, EOMI. Oropharynx is clear. Neck is nontender and supple without adenopathy or JVD. Back is nontender and there is no CVA tenderness. Lungs are clear without rales, wheezes, or rhonchi. Chest is nontender. Heart has regular rate and rhythm without murmur. Abdomen is soft, flat, nontender without masses or hepatosplenomegaly and peristalsis is hypoactive. Extremities have no cyanosis or edema, full range of motion is present. Skin is warm and dry without rash. Neurologic: Oriented to person and place but not time, cranial nerves are intact, there are no motor or sensory deficits.  ED Results / Procedures / Treatments   Labs (all labs ordered are listed, but only abnormal results are displayed) Labs Reviewed  BASIC METABOLIC PANEL - Abnormal; Notable for the following components:      Result Value   Glucose, Bld 143 (*)    Calcium 8.7 (*)    All other components within  normal limits  CBC WITH DIFFERENTIAL/PLATELET - Abnormal; Notable for the following components:   WBC 13.6 (*)    Neutro Abs 12.3 (*)    All other components within normal limits  URINALYSIS, ROUTINE W REFLEX MICROSCOPIC - Abnormal; Notable for the following components:   Hgb urine dipstick SMALL (*)    Ketones, ur 20 (*)    Leukocytes,Ua MODERATE (*)    Non Squamous Epithelial 0-5 (*)    All other components within normal limits  SARS CORONAVIRUS 2 (TAT 6-24 HRS)   Procedures Procedures  Medications Ordered in ED Medications  sodium chloride 0.9 % bolus 1,000 mL (has no administration in time  range)  ondansetron (ZOFRAN) injection 4 mg (has no administration in time range)    ED Course  I have reviewed the triage vital signs and the nursing notes.  Pertinent lab results that were available during my care of the patient were reviewed by me and considered in my medical decision making (see chart for details).  MDM Rules/Calculators/A&P Nausea and vomiting, most likely viral gastritis.  Doubt bowel obstruction.  History of exposure to COVID-19.  Vomiting could be part of COVID-19 syndrome.  Will send specimen for COVID-19, check electrolytes and give IV fluids and ondansetron.  Old records are reviewed, and she has no relevant past visits.  Labs are unremarkable.  Urine does have small number of leukocytes, but does not appear grossly infected and I do not feel antibiotics are appropriate.  She is discharged with prescription for ondansetron.  COZETTE BRAGGS was evaluated in Emergency Department on 09/22/2019 for the symptoms described in the history of present illness. She was evaluated in the context of the global COVID-19 pandemic, which necessitated consideration that the patient might be at risk for infection with the SARS-CoV-2 virus that causes COVID-19. Institutional protocols and algorithms that pertain to the evaluation of patients at risk for COVID-19 are in a state of rapid change based on information released by regulatory bodies including the CDC and federal and state organizations. These policies and algorithms were followed during the patient's care in the ED.  Final Clinical Impression(s) / ED Diagnoses Final diagnoses:  Non-intractable vomiting with nausea, unspecified vomiting type  Exposure to COVID-19 virus    Rx / DC Orders ED Discharge Orders         Ordered    ondansetron (ZOFRAN) 4 MG tablet  Every 6 hours PRN     09/23/19 0101           Dione Booze, MD 09/23/19 [phone]

## 2019-09-23 LAB — SARS CORONAVIRUS 2 (TAT 6-24 HRS): SARS Coronavirus 2: POSITIVE — AB

## 2019-09-23 LAB — URINALYSIS, ROUTINE W REFLEX MICROSCOPIC
Bacteria, UA: NONE SEEN
Bilirubin Urine: NEGATIVE
Glucose, UA: NEGATIVE mg/dL
Ketones, ur: 20 mg/dL — AB
Nitrite: NEGATIVE
Protein, ur: NEGATIVE mg/dL
Specific Gravity, Urine: 1.017 (ref 1.005–1.030)
pH: 5 (ref 5.0–8.0)

## 2019-09-23 MED ORDER — ONDANSETRON HCL 4 MG PO TABS: 4.0000 mg | ORAL_TABLET | Freq: Four times a day (QID) | ORAL | 0 refills | Status: DC | PRN

## 2019-09-23 NOTE — Discharge Instructions (Signed)
Return if you are having any problems.  Your test for COVID-19 should be available sometime Tuesday. You can see that report on MyChart.

## 2019-12-18 DIAGNOSIS — E0590 Thyrotoxicosis, unspecified without thyrotoxic crisis or storm: Secondary | ICD-10-CM | POA: Diagnosis not present

## 2019-12-18 DIAGNOSIS — R413 Other amnesia: Secondary | ICD-10-CM | POA: Diagnosis not present

## 2019-12-18 DIAGNOSIS — E041 Nontoxic single thyroid nodule: Secondary | ICD-10-CM | POA: Diagnosis not present

## 2020-01-26 DIAGNOSIS — E0590 Thyrotoxicosis, unspecified without thyrotoxic crisis or storm: Secondary | ICD-10-CM | POA: Diagnosis not present

## 2020-01-29 DIAGNOSIS — R413 Other amnesia: Secondary | ICD-10-CM | POA: Diagnosis not present

## 2020-01-29 DIAGNOSIS — E0590 Thyrotoxicosis, unspecified without thyrotoxic crisis or storm: Secondary | ICD-10-CM | POA: Diagnosis not present

## 2020-01-29 DIAGNOSIS — E041 Nontoxic single thyroid nodule: Secondary | ICD-10-CM | POA: Diagnosis not present

## 2020-02-23 ENCOUNTER — Encounter (HOSPITAL_COMMUNITY): Payer: Self-pay | Admitting: *Deleted

## 2020-02-23 ENCOUNTER — Emergency Department (HOSPITAL_COMMUNITY): Payer: Medicare Other

## 2020-02-23 ENCOUNTER — Other Ambulatory Visit: Payer: Self-pay

## 2020-02-23 ENCOUNTER — Emergency Department (HOSPITAL_COMMUNITY)
Admission: EM | Admit: 2020-02-23 | Discharge: 2020-02-23 | Disposition: A | Discharge status: Home / Self Care | Payer: Medicare Other | Attending: Emergency Medicine | Admitting: Emergency Medicine

## 2020-02-23 DIAGNOSIS — Y9389 Activity, other specified: Secondary | ICD-10-CM | POA: Insufficient documentation

## 2020-02-23 DIAGNOSIS — W010XXA Fall on same level from slipping, tripping and stumbling without subsequent striking against object, initial encounter: Secondary | ICD-10-CM | POA: Diagnosis not present

## 2020-02-23 DIAGNOSIS — Y999 Unspecified external cause status: Secondary | ICD-10-CM | POA: Insufficient documentation

## 2020-02-23 DIAGNOSIS — E039 Hypothyroidism, unspecified: Secondary | ICD-10-CM | POA: Diagnosis not present

## 2020-02-23 DIAGNOSIS — Y9289 Other specified places as the place of occurrence of the external cause: Secondary | ICD-10-CM | POA: Insufficient documentation

## 2020-02-23 DIAGNOSIS — S40912A Unspecified superficial injury of left shoulder, initial encounter: Secondary | ICD-10-CM | POA: Diagnosis present

## 2020-02-23 DIAGNOSIS — S42252A Displaced fracture of greater tuberosity of left humerus, initial encounter for closed fracture: Secondary | ICD-10-CM | POA: Diagnosis not present

## 2020-02-23 DIAGNOSIS — S42202A Unspecified fracture of upper end of left humerus, initial encounter for closed fracture: Secondary | ICD-10-CM | POA: Diagnosis not present

## 2020-02-23 DIAGNOSIS — M25512 Pain in left shoulder: Secondary | ICD-10-CM | POA: Diagnosis not present

## 2020-02-23 DIAGNOSIS — S43005A Unspecified dislocation of left shoulder joint, initial encounter: Secondary | ICD-10-CM | POA: Diagnosis not present

## 2020-02-23 DIAGNOSIS — S43005S Unspecified dislocation of left shoulder joint, sequela: Secondary | ICD-10-CM

## 2020-02-23 DIAGNOSIS — S43015A Anterior dislocation of left humerus, initial encounter: Secondary | ICD-10-CM | POA: Diagnosis not present

## 2020-02-23 MED ORDER — HYDROCODONE-ACETAMINOPHEN 5-325 MG PO TABS: 2.0000 | ORAL_TABLET | Freq: Two times a day (BID) | ORAL | 0 refills | Status: DC | PRN

## 2020-02-23 MED ORDER — ACETAMINOPHEN ER 650 MG PO TBCR
650.0000 mg | EXTENDED_RELEASE_TABLET | Freq: Four times a day (QID) | ORAL | 0 refills | Status: DC
Start: 2020-02-23 — End: 2022-02-21

## 2020-02-23 NOTE — ED Provider Notes (Signed)
[facility] EMERGENCY DEPARTMENT Provider Note   CSN: [id number] Arrival date & time: 02/23/20  1155     History Chief Complaint  Patient presents with  . Shoulder Pain    Laura Young is a 85 y.o. female.  HPI    85 year old female comes in a chief complaint of shoulder pain.  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  She fell onto the left side and is having shoulder pain.  She did not strike her head and is not having any headaches, nausea, vision changes, dizziness, numbness or weakness.  She is only having pain over the left shoulder area.  Past Medical History:  Diagnosis Date  . Hypothyroidism    goiter    There are no problems to display for this patient.   Past Surgical History:  Procedure Laterality Date  . ABDOMINAL HYSTERECTOMY    . CHOLECYSTECTOMY       OB History   No obstetric history on file.     No family history on file.  Social History   Tobacco Use  . Smoking status: Never Smoker  . Smokeless tobacco: Never Used  Vaping Use  . Vaping Use: Never assessed  Substance Use Topics  . Alcohol use: Never  . Drug use: Never    Home Medications Prior to Admission medications   Medication Sig Start Date End Date Taking? Authorizing Provider  acetaminophen (TYLENOL 8 HOUR) 650 MG CR tablet Take 1 tablet (650 mg total) by mouth every 6 (six) hours. 02/23/20   Derwood Kaplan, MD  cephALEXin (KEFLEX) 500 MG capsule Take 1 capsule (500 mg total) by mouth 2 (two) times daily. 10/16/18   Rancour, Jeannett Senior, MD  HYDROcodone-acetaminophen (NORCO/VICODIN) 5-325 MG tablet Take 2 tablets by mouth every 12 (twelve) hours as needed for severe pain. 02/23/20   Derwood Kaplan, MD  levothyroxine (SYNTHROID, LEVOTHROID) 50 MCG tablet Take 50 mcg by mouth daily before breakfast.    [provider]  ondansetron (ZOFRAN ODT) 4 MG disintegrating tablet Take 1 tablet (4 mg total) by mouth every 8  (eight) hours as needed for nausea or vomiting. 10/16/18   Rancour, Jeannett Senior, MD  ondansetron (ZOFRAN) 4 MG tablet Take 1 tablet (4 mg total) by mouth every 6 (six) hours as needed. 09/23/19   Dione Booze, MD    Allergies    Patient has no known allergies.  Review of Systems   Review of Systems  Constitutional: Positive for activity change.  Musculoskeletal: Positive for arthralgias.    Physical Exam Updated Vital Signs BP (!) 160/46 (BP Location: Right Arm)   Pulse 65   Temp 97.6 F (36.4 C) (Oral)   Resp 16   SpO2 98%   Physical Exam Vitals and nursing note reviewed.  Constitutional:      Appearance: She is well-developed.  HENT:     Head: Normocephalic and atraumatic.  Cardiovascular:     Rate and Rhythm: Normal rate.  Pulmonary:     Effort: Pulmonary effort is normal.  Abdominal:     General: Bowel sounds are normal.  Musculoskeletal:        General: Tenderness and deformity present.     Cervical back: Normal range of motion and neck supple.     Comments: Left shoulder is deformed and tender  Skin:    General: Skin is warm and dry.  Neurological:     Mental Status: She is alert and oriented to person,  place, and time.     ED Results / Procedures / Treatments   Labs (all labs ordered are listed, but only abnormal results are displayed) Labs Reviewed - No data to display  EKG None  Radiology DG Shoulder Left  Result Date: 02/23/2020 CLINICAL DATA:  Left shoulder pain after fall. EXAM: LEFT SHOULDER - 2+ VIEW COMPARISON:  None. FINDINGS: Anterior dislocation of proximal left humeral head is noted. Probable mildly displaced fractures are seen involving the greater tuberosity of the proximal left humerus. Visualized ribs are unremarkable. IMPRESSION: Anterior dislocation of proximal left humeral head with probable mildly displaced fracture involving greater tuberosity. Electronically Signed   By: Lupita Raider M.D.   On: 02/23/2020 13:19   DG Shoulder Left  Port  Result Date: 02/23/2020 CLINICAL DATA:  85 year old female status post reduction of left shoulder fracture dislocation. EXAM: LEFT SHOULDER COMPARISON:  1258 hours today. FINDINGS: Three portable views of the left shoulder. Normalized glenohumeral joint alignment. Mildly comminuted but minimally displaced fracture of the proximal left humerus greater tuberosity is evident. The left scapula and clavicle appear intact. Negative visible left ribs and lung parenchyma. IMPRESSION: Reduced left shoulder dislocation. Associated minimally displaced fracture of the greater tuberosity. Electronically Signed   By: Odessa Fleming M.D.   On: 02/23/2020 15:26    Procedures Reduction of dislocation  Date/Time: 02/23/2020 4:28 PM Performed by: Derwood Kaplan, MD Authorized by: Derwood Kaplan, MD  Consent: Verbal consent obtained. Risks and benefits: risks, benefits and alternatives were discussed Consent given by: patient and guardian Patient understanding: patient states understanding of the procedure being performed Imaging studies: imaging studies available Patient identity confirmed: arm band Time out: Immediately prior to procedure a "time out" was called to verify the correct patient, procedure, equipment, support staff and site/side marked as required. Preparation: Patient was prepped and draped in the usual sterile fashion. Local anesthesia used: no  Anesthesia: Local anesthesia used: no  Sedation: Patient sedated: no  Patient tolerance: patient tolerated the procedure well with no immediate complications    (including critical care time)  Medications Ordered in ED Medications - No data to display  ED Course  I have reviewed the triage vital signs and the nursing notes.  Pertinent labs & imaging results that were available during my care of the patient were reviewed by me and considered in my medical decision making (see chart for details).    MDM Rules/Calculators/A&P                           85 year old female comes in a chief complaint of left shoulder pain that started after she had a fall.  She has a left-sided fracture dislocation.  Shoulder was reduced without any complication. Patient will be provided follow-up with orthopedist.  She has been made aware of both the fracture and the dislocation, and the need for bracing the shoulder, sling placement until better.  Final Clinical Impression(s) / ED Diagnoses Final diagnoses:  Shoulder dislocation, left, initial encounter  Closed fracture of proximal end of left humerus, unspecified fracture morphology, initial encounter    Rx / DC Orders ED Discharge Orders         Ordered    acetaminophen (TYLENOL 8 HOUR) 650 MG CR tablet  Every 6 hours     Discontinue  Reprint     02/23/20 1601    HYDROcodone-acetaminophen (NORCO/VICODIN) 5-325 MG tablet  Every 12 hours PRN     Discontinue  Reprint     02/23/20 [place], MD 02/23/20 1630

## 2020-02-23 NOTE — ED Triage Notes (Signed)
Feel today at home and injured her left shoulder.

## 2020-02-23 NOTE — Discharge Instructions (Signed)
He was seen in the ER after you had a fall.  You have a small fracture to the shoulder.  Your shoulder was also dislocated.  Shoulder has not been reduced and we recommend that you follow-up with the orthopedic surgeon in 1 week for further evaluation.  Keep the arm in sling.  Ice and heat therapy will help. Take the medications prescribed for pain control. Take the narcotic pain medicine only if the pain is excruciating.  When you take the narcotic medicine be very careful when trying to move around, as they lead to poor balance and increased risk of fall.

## 2020-03-01 ENCOUNTER — Ambulatory Visit: Payer: Medicare Other | Admitting: Orthopedic Surgery

## 2020-03-01 ENCOUNTER — Other Ambulatory Visit: Payer: Self-pay

## 2020-03-01 ENCOUNTER — Encounter: Payer: Self-pay | Admitting: Orthopedic Surgery

## 2020-03-01 VITALS — Ht 62.0 in | Wt 99.6 lb

## 2020-03-01 DIAGNOSIS — S43015A Anterior dislocation of left humerus, initial encounter: Secondary | ICD-10-CM

## 2020-03-01 DIAGNOSIS — Z1389 Encounter for screening for other disorder: Secondary | ICD-10-CM | POA: Diagnosis not present

## 2020-03-01 DIAGNOSIS — N342 Other urethritis: Secondary | ICD-10-CM | POA: Diagnosis not present

## 2020-03-01 NOTE — Progress Notes (Signed)
Dislocation shoulder NEW PROBLEM//OFFICE VISIT  Chief Complaint  Patient presents with  . Shoulder Pain    Larey Seat and fractured left humerus  DOI 02/23/20    85 year old female dislocated her left shoulder had a closed reduction in the emergency room on February 23, 2020  She is 1 week post treatment with a sling  She had a greater tuberosity avulsion fracture with the anterior dislocation  She has severe dementia  She is in a sling relatively comfortable.   Review of Systems  Unable to perform ROS: Dementia     Past Medical History:  Diagnosis Date  . Hypothyroidism    goiter    Past Surgical History:  Procedure Laterality Date  . ABDOMINAL HYSTERECTOMY    . CHOLECYSTECTOMY      History reviewed. No pertinent family history. Social History   Tobacco Use  . Smoking status: Never Smoker  . Smokeless tobacco: Never Used  Vaping Use  . Vaping Use: Never assessed  Substance Use Topics  . Alcohol use: Never  . Drug use: Never    No Known Allergies  Current Meds  Medication Sig  . acetaminophen (TYLENOL 8 HOUR) 650 MG CR tablet Take 1 tablet (650 mg total) by mouth every 6 (six) hours.  . cephALEXin (KEFLEX) 500 MG capsule Take 1 capsule (500 mg total) by mouth 2 (two) times daily.  Marland Kitchen HYDROcodone-acetaminophen (NORCO/VICODIN) 5-325 MG tablet Take 2 tablets by mouth every 12 (twelve) hours as needed for severe pain.  Marland Kitchen levothyroxine (SYNTHROID, LEVOTHROID) 50 MCG tablet Take 50 mcg by mouth daily before breakfast.  . ondansetron (ZOFRAN ODT) 4 MG disintegrating tablet Take 1 tablet (4 mg total) by mouth every 8 (eight) hours as needed for nausea or vomiting.  . ondansetron (ZOFRAN) 4 MG tablet Take 1 tablet (4 mg total) by mouth every 6 (six) hours as needed.    Ht 5\' 2"  (1.575 m)   Wt 99 lb 9.6 oz (45.2 kg)   BMI 18.22 kg/m   Physical Exam Small thin framed, alert awake.  Mood pleasant affect normal Ortho Exam  Left shoulder tenderness proximal humerus skin  is bruised and ecchymotic patient can move her elbow and hand normally neurovascular exam is intact including deltoid nerve function  MEDICAL DECISION MAKING  A.  Encounter Diagnosis  Name Primary?  Anterior shoulder dislocation, left, initial encounter Yes    B. DATA ANALYSED:    IMAGING: Independent interpretation of images: X-rays show a dislocation of the shoulder with anterior there is an acute fulgent fracture of the greater tuberosity  Post reduction film shows shoulder is reduced C. MANAGEMENT   Plan is to remove the sling come back in 3 weeks to check the rotator cuff for tearing  No orders of the defined types were placed in this encounter.     Marland Kitchen, MD  03/01/2020 11:08 AM

## 2020-03-10 DIAGNOSIS — N390 Urinary tract infection, site not specified: Secondary | ICD-10-CM | POA: Diagnosis not present

## 2020-03-11 DIAGNOSIS — E0590 Thyrotoxicosis, unspecified without thyrotoxic crisis or storm: Secondary | ICD-10-CM | POA: Diagnosis not present

## 2020-03-16 DIAGNOSIS — E041 Nontoxic single thyroid nodule: Secondary | ICD-10-CM | POA: Diagnosis not present

## 2020-03-16 DIAGNOSIS — E0590 Thyrotoxicosis, unspecified without thyrotoxic crisis or storm: Secondary | ICD-10-CM | POA: Diagnosis not present

## 2020-03-16 DIAGNOSIS — I739 Peripheral vascular disease, unspecified: Secondary | ICD-10-CM | POA: Diagnosis not present

## 2020-03-16 DIAGNOSIS — M79671 Pain in right foot: Secondary | ICD-10-CM | POA: Diagnosis not present

## 2020-03-16 DIAGNOSIS — M79672 Pain in left foot: Secondary | ICD-10-CM | POA: Diagnosis not present

## 2020-03-16 DIAGNOSIS — R413 Other amnesia: Secondary | ICD-10-CM | POA: Diagnosis not present

## 2020-03-23 DIAGNOSIS — Z1389 Encounter for screening for other disorder: Secondary | ICD-10-CM | POA: Diagnosis not present

## 2020-03-23 DIAGNOSIS — Z0001 Encounter for general adult medical examination with abnormal findings: Secondary | ICD-10-CM | POA: Diagnosis not present

## 2020-03-29 ENCOUNTER — Ambulatory Visit (INDEPENDENT_AMBULATORY_CARE_PROVIDER_SITE_OTHER): Payer: Medicare Other | Admitting: Orthopedic Surgery

## 2020-03-29 ENCOUNTER — Encounter: Payer: Self-pay | Admitting: Orthopedic Surgery

## 2020-03-29 ENCOUNTER — Other Ambulatory Visit: Payer: Self-pay

## 2020-03-29 VITALS — Ht 62.0 in | Wt 99.6 lb

## 2020-03-29 DIAGNOSIS — S43005D Unspecified dislocation of left shoulder joint, subsequent encounter: Secondary | ICD-10-CM

## 2020-03-29 NOTE — Progress Notes (Signed)
Chief Complaint  Patient presents with   Shoulder Injury    shoulder dislocation left/ no pain any more/ doing fine     S/p left shoulder anterior dislocation and small fracture of the greater tuberosity presents back with no pain in the left shoulder  Active elevation of 130 degrees mild weakness  Based on age activity level and return of function recommend nonoperative treatment  Patient will follow up as needed  Encounter Diagnosis  Name Primary?   Shoulder dislocation, left, subsequent encounter Yes

## 2020-04-12 DIAGNOSIS — R413 Other amnesia: Secondary | ICD-10-CM | POA: Diagnosis not present

## 2020-04-12 DIAGNOSIS — E041 Nontoxic single thyroid nodule: Secondary | ICD-10-CM | POA: Diagnosis not present

## 2020-04-12 DIAGNOSIS — E0590 Thyrotoxicosis, unspecified without thyrotoxic crisis or storm: Secondary | ICD-10-CM | POA: Diagnosis not present

## 2020-05-10 DIAGNOSIS — E0590 Thyrotoxicosis, unspecified without thyrotoxic crisis or storm: Secondary | ICD-10-CM | POA: Diagnosis not present

## 2020-05-13 DIAGNOSIS — E041 Nontoxic single thyroid nodule: Secondary | ICD-10-CM | POA: Diagnosis not present

## 2020-05-13 DIAGNOSIS — E0590 Thyrotoxicosis, unspecified without thyrotoxic crisis or storm: Secondary | ICD-10-CM | POA: Diagnosis not present

## 2020-05-13 DIAGNOSIS — Z681 Body mass index (BMI) 19 or less, adult: Secondary | ICD-10-CM | POA: Diagnosis not present

## 2020-05-13 DIAGNOSIS — R413 Other amnesia: Secondary | ICD-10-CM | POA: Diagnosis not present

## 2020-06-14 DIAGNOSIS — M79672 Pain in left foot: Secondary | ICD-10-CM | POA: Diagnosis not present

## 2020-06-14 DIAGNOSIS — M79671 Pain in right foot: Secondary | ICD-10-CM | POA: Diagnosis not present

## 2020-06-14 DIAGNOSIS — I739 Peripheral vascular disease, unspecified: Secondary | ICD-10-CM | POA: Diagnosis not present

## 2020-07-12 DIAGNOSIS — E0590 Thyrotoxicosis, unspecified without thyrotoxic crisis or storm: Secondary | ICD-10-CM | POA: Diagnosis not present

## 2020-07-15 DIAGNOSIS — R413 Other amnesia: Secondary | ICD-10-CM | POA: Diagnosis not present

## 2020-07-15 DIAGNOSIS — E0590 Thyrotoxicosis, unspecified without thyrotoxic crisis or storm: Secondary | ICD-10-CM | POA: Diagnosis not present

## 2020-07-15 DIAGNOSIS — Z681 Body mass index (BMI) 19 or less, adult: Secondary | ICD-10-CM | POA: Diagnosis not present

## 2020-07-15 DIAGNOSIS — E041 Nontoxic single thyroid nodule: Secondary | ICD-10-CM | POA: Diagnosis not present

## 2020-07-28 DIAGNOSIS — E559 Vitamin D deficiency, unspecified: Secondary | ICD-10-CM | POA: Diagnosis not present

## 2020-07-28 DIAGNOSIS — R222 Localized swelling, mass and lump, trunk: Secondary | ICD-10-CM | POA: Diagnosis not present

## 2020-07-28 DIAGNOSIS — Z681 Body mass index (BMI) 19 or less, adult: Secondary | ICD-10-CM | POA: Diagnosis not present

## 2020-07-28 DIAGNOSIS — Z1159 Encounter for screening for other viral diseases: Secondary | ICD-10-CM | POA: Diagnosis not present

## 2020-08-23 DIAGNOSIS — M79672 Pain in left foot: Secondary | ICD-10-CM | POA: Diagnosis not present

## 2020-08-23 DIAGNOSIS — I739 Peripheral vascular disease, unspecified: Secondary | ICD-10-CM | POA: Diagnosis not present

## 2020-08-23 DIAGNOSIS — M79671 Pain in right foot: Secondary | ICD-10-CM | POA: Diagnosis not present

## 2020-10-07 DIAGNOSIS — Z23 Encounter for immunization: Secondary | ICD-10-CM | POA: Diagnosis not present

## 2020-10-10 ENCOUNTER — Emergency Department (HOSPITAL_COMMUNITY)
Admission: EM | Admit: 2020-10-10 | Discharge: 2020-10-10 | Disposition: A | Discharge status: Home / Self Care | Payer: Medicare Other | Attending: Emergency Medicine | Admitting: Emergency Medicine

## 2020-10-10 ENCOUNTER — Other Ambulatory Visit: Payer: Self-pay

## 2020-10-10 DIAGNOSIS — Z189 Retained foreign body fragments, unspecified material: Secondary | ICD-10-CM | POA: Insufficient documentation

## 2020-10-10 DIAGNOSIS — Z5321 Procedure and treatment not carried out due to patient leaving prior to being seen by health care provider: Secondary | ICD-10-CM | POA: Diagnosis not present

## 2020-10-14 DIAGNOSIS — E0590 Thyrotoxicosis, unspecified without thyrotoxic crisis or storm: Secondary | ICD-10-CM | POA: Diagnosis not present

## 2020-10-21 DIAGNOSIS — E0590 Thyrotoxicosis, unspecified without thyrotoxic crisis or storm: Secondary | ICD-10-CM | POA: Diagnosis not present

## 2020-10-21 DIAGNOSIS — R413 Other amnesia: Secondary | ICD-10-CM | POA: Diagnosis not present

## 2020-10-21 DIAGNOSIS — Z681 Body mass index (BMI) 19 or less, adult: Secondary | ICD-10-CM | POA: Diagnosis not present

## 2020-10-21 DIAGNOSIS — E041 Nontoxic single thyroid nodule: Secondary | ICD-10-CM | POA: Diagnosis not present

## 2020-11-08 DIAGNOSIS — M79671 Pain in right foot: Secondary | ICD-10-CM | POA: Diagnosis not present

## 2020-11-08 DIAGNOSIS — I739 Peripheral vascular disease, unspecified: Secondary | ICD-10-CM | POA: Diagnosis not present

## 2020-11-08 DIAGNOSIS — M79672 Pain in left foot: Secondary | ICD-10-CM | POA: Diagnosis not present

## 2021-01-31 DIAGNOSIS — I739 Peripheral vascular disease, unspecified: Secondary | ICD-10-CM | POA: Diagnosis not present

## 2021-01-31 DIAGNOSIS — M79672 Pain in left foot: Secondary | ICD-10-CM | POA: Diagnosis not present

## 2021-01-31 DIAGNOSIS — M79671 Pain in right foot: Secondary | ICD-10-CM | POA: Diagnosis not present

## 2021-02-14 DIAGNOSIS — E0590 Thyrotoxicosis, unspecified without thyrotoxic crisis or storm: Secondary | ICD-10-CM | POA: Diagnosis not present

## 2021-02-14 DIAGNOSIS — E559 Vitamin D deficiency, unspecified: Secondary | ICD-10-CM | POA: Diagnosis not present

## 2021-02-21 DIAGNOSIS — E041 Nontoxic single thyroid nodule: Secondary | ICD-10-CM | POA: Diagnosis not present

## 2021-02-21 DIAGNOSIS — R413 Other amnesia: Secondary | ICD-10-CM | POA: Diagnosis not present

## 2021-02-21 DIAGNOSIS — E0590 Thyrotoxicosis, unspecified without thyrotoxic crisis or storm: Secondary | ICD-10-CM | POA: Diagnosis not present

## 2021-02-21 DIAGNOSIS — Z681 Body mass index (BMI) 19 or less, adult: Secondary | ICD-10-CM | POA: Diagnosis not present

## 2021-03-24 DIAGNOSIS — E049 Nontoxic goiter, unspecified: Secondary | ICD-10-CM | POA: Diagnosis not present

## 2021-03-24 DIAGNOSIS — E039 Hypothyroidism, unspecified: Secondary | ICD-10-CM | POA: Diagnosis not present

## 2021-03-24 DIAGNOSIS — Z681 Body mass index (BMI) 19 or less, adult: Secondary | ICD-10-CM | POA: Diagnosis not present

## 2021-03-24 DIAGNOSIS — R413 Other amnesia: Secondary | ICD-10-CM | POA: Diagnosis not present

## 2021-03-24 DIAGNOSIS — Z0001 Encounter for general adult medical examination with abnormal findings: Secondary | ICD-10-CM | POA: Diagnosis not present

## 2021-04-18 DIAGNOSIS — M79672 Pain in left foot: Secondary | ICD-10-CM | POA: Diagnosis not present

## 2021-04-18 DIAGNOSIS — I739 Peripheral vascular disease, unspecified: Secondary | ICD-10-CM | POA: Diagnosis not present

## 2021-04-18 DIAGNOSIS — M79671 Pain in right foot: Secondary | ICD-10-CM | POA: Diagnosis not present

## 2021-04-25 DIAGNOSIS — E0590 Thyrotoxicosis, unspecified without thyrotoxic crisis or storm: Secondary | ICD-10-CM | POA: Diagnosis not present

## 2021-06-23 DIAGNOSIS — E0590 Thyrotoxicosis, unspecified without thyrotoxic crisis or storm: Secondary | ICD-10-CM | POA: Diagnosis not present

## 2021-06-27 DIAGNOSIS — M79672 Pain in left foot: Secondary | ICD-10-CM | POA: Diagnosis not present

## 2021-06-27 DIAGNOSIS — I739 Peripheral vascular disease, unspecified: Secondary | ICD-10-CM | POA: Diagnosis not present

## 2021-06-27 DIAGNOSIS — M79671 Pain in right foot: Secondary | ICD-10-CM | POA: Diagnosis not present

## 2021-06-30 DIAGNOSIS — E041 Nontoxic single thyroid nodule: Secondary | ICD-10-CM | POA: Diagnosis not present

## 2021-06-30 DIAGNOSIS — R413 Other amnesia: Secondary | ICD-10-CM | POA: Diagnosis not present

## 2021-06-30 DIAGNOSIS — E0590 Thyrotoxicosis, unspecified without thyrotoxic crisis or storm: Secondary | ICD-10-CM | POA: Diagnosis not present

## 2021-06-30 DIAGNOSIS — Z681 Body mass index (BMI) 19 or less, adult: Secondary | ICD-10-CM | POA: Diagnosis not present

## 2021-07-28 DIAGNOSIS — Z23 Encounter for immunization: Secondary | ICD-10-CM | POA: Diagnosis not present

## 2021-08-30 DIAGNOSIS — E559 Vitamin D deficiency, unspecified: Secondary | ICD-10-CM | POA: Diagnosis not present

## 2021-08-30 DIAGNOSIS — E0590 Thyrotoxicosis, unspecified without thyrotoxic crisis or storm: Secondary | ICD-10-CM | POA: Diagnosis not present

## 2021-09-06 DIAGNOSIS — R413 Other amnesia: Secondary | ICD-10-CM | POA: Diagnosis not present

## 2021-09-06 DIAGNOSIS — E041 Nontoxic single thyroid nodule: Secondary | ICD-10-CM | POA: Diagnosis not present

## 2021-09-06 DIAGNOSIS — E0590 Thyrotoxicosis, unspecified without thyrotoxic crisis or storm: Secondary | ICD-10-CM | POA: Diagnosis not present

## 2021-09-06 DIAGNOSIS — E559 Vitamin D deficiency, unspecified: Secondary | ICD-10-CM | POA: Diagnosis not present

## 2021-09-06 DIAGNOSIS — Z681 Body mass index (BMI) 19 or less, adult: Secondary | ICD-10-CM | POA: Diagnosis not present

## 2021-09-19 DIAGNOSIS — I739 Peripheral vascular disease, unspecified: Secondary | ICD-10-CM | POA: Diagnosis not present

## 2021-09-19 DIAGNOSIS — M79672 Pain in left foot: Secondary | ICD-10-CM | POA: Diagnosis not present

## 2021-09-19 DIAGNOSIS — M79671 Pain in right foot: Secondary | ICD-10-CM | POA: Diagnosis not present

## 2021-11-27 IMAGING — DX DG SHOULDER 1V*L*
3 series · 3 of 3 positions shown · non-contrast
Comparison: 2974 hours today.

CLINICAL DATA: 84-year-old female status post reduction of left
shoulder fracture dislocation.

EXAM:
LEFT SHOULDER

[shoulder ap (1 of 3)]
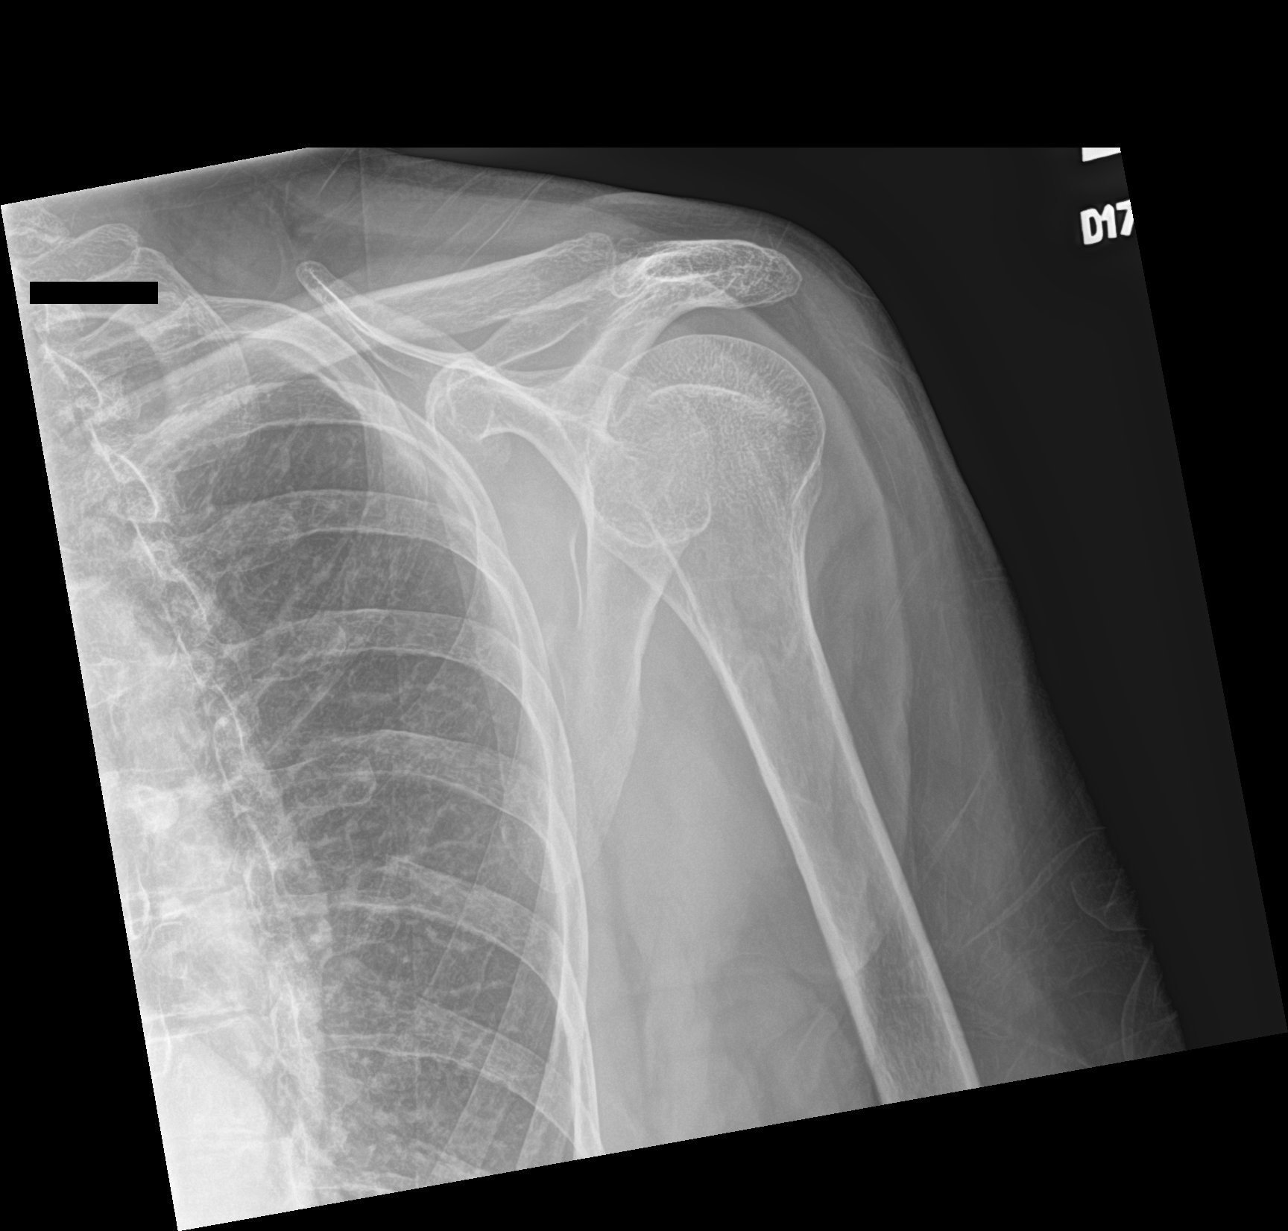

[shoulder ap (2 of 3)]
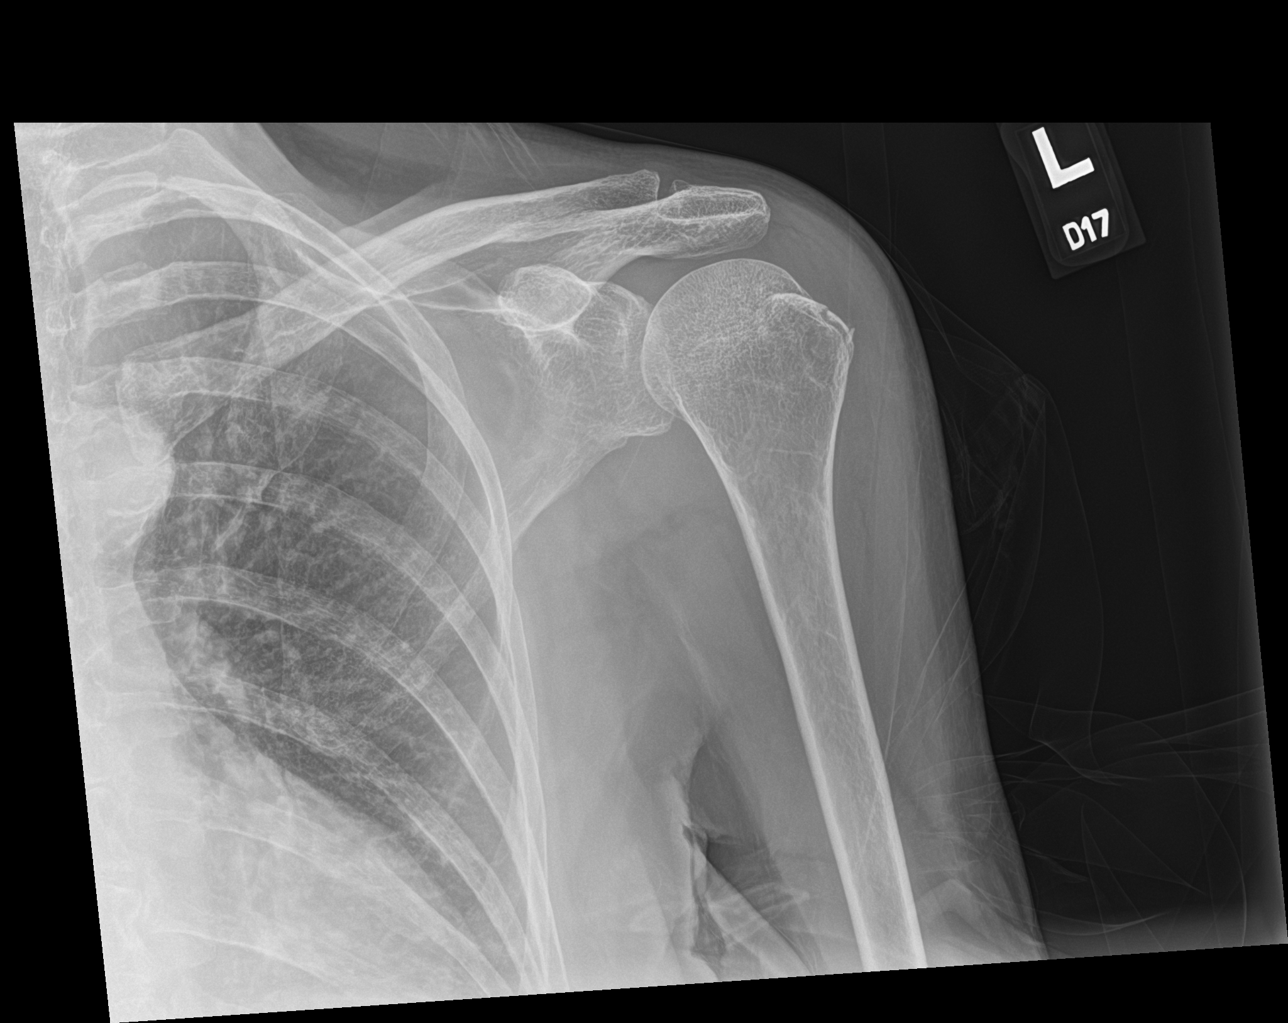

[shoulder ap (3 of 3)]
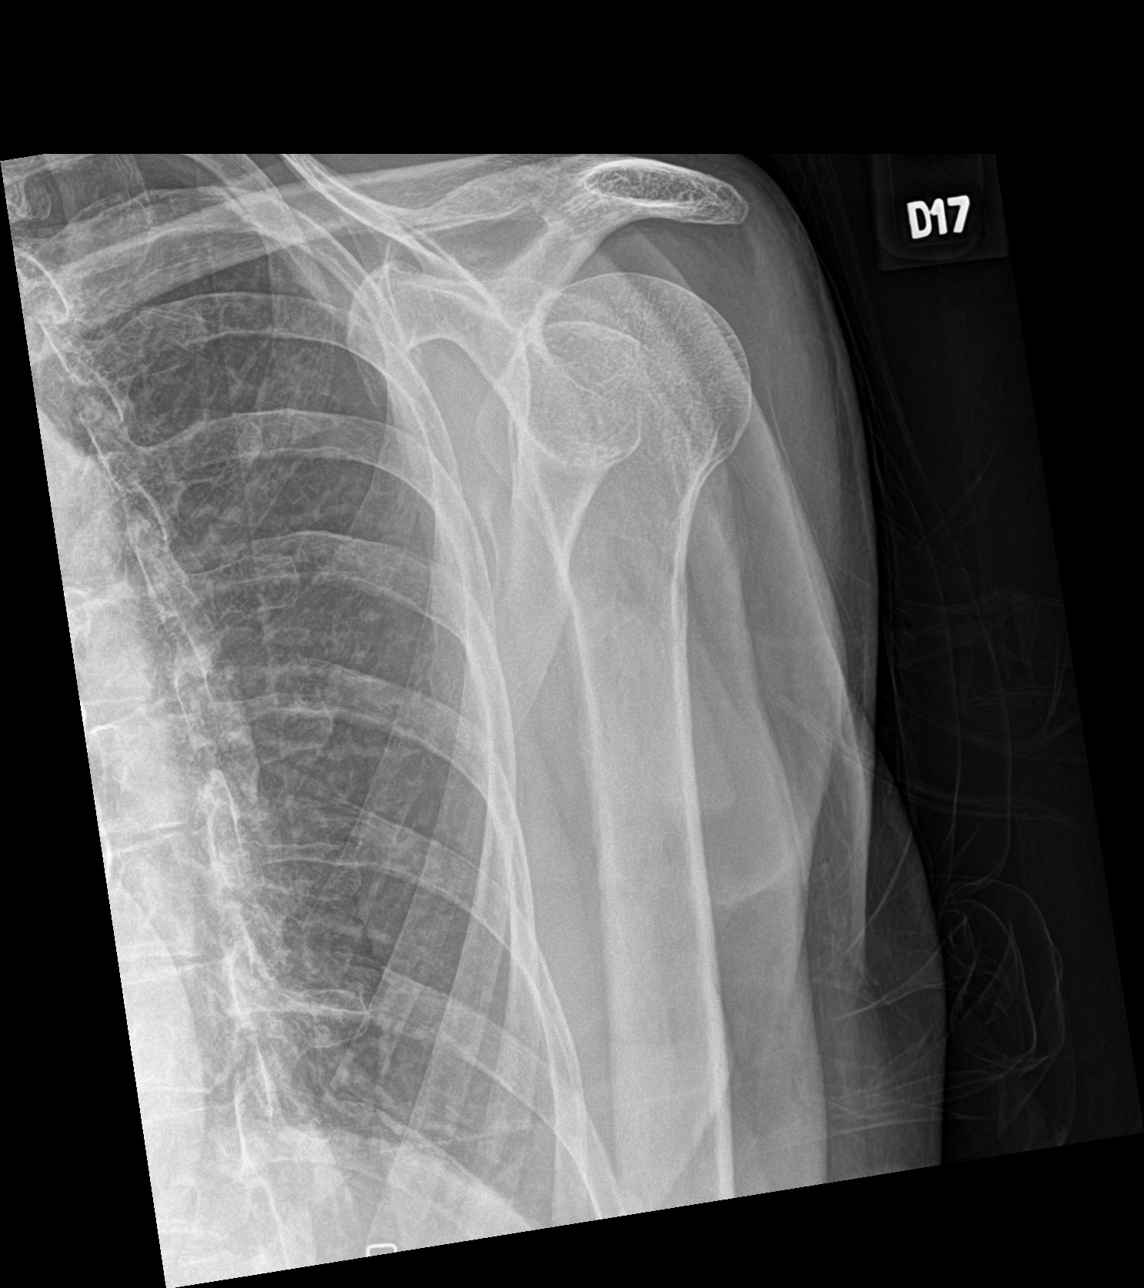

[3 of 3 positions shown; findings below may reference images not displayed]

FINDINGS: Three portable views of the left shoulder. Normalized glenohumeral
joint alignment. Mildly comminuted but minimally displaced fracture
of the proximal left humerus greater tuberosity is evident. The left
scapula and clavicle appear intact. Negative visible left ribs and
lung parenchyma.
IMPRESSION: Reduced left shoulder dislocation. Associated minimally displaced
fracture of the greater tuberosity.

## 2021-11-28 DIAGNOSIS — I739 Peripheral vascular disease, unspecified: Secondary | ICD-10-CM | POA: Diagnosis not present

## 2021-11-28 DIAGNOSIS — M79672 Pain in left foot: Secondary | ICD-10-CM | POA: Diagnosis not present

## 2021-11-28 DIAGNOSIS — M79671 Pain in right foot: Secondary | ICD-10-CM | POA: Diagnosis not present

## 2021-12-28 DIAGNOSIS — E049 Nontoxic goiter, unspecified: Secondary | ICD-10-CM | POA: Diagnosis not present

## 2021-12-28 DIAGNOSIS — E0590 Thyrotoxicosis, unspecified without thyrotoxic crisis or storm: Secondary | ICD-10-CM | POA: Diagnosis not present

## 2021-12-28 DIAGNOSIS — Z681 Body mass index (BMI) 19 or less, adult: Secondary | ICD-10-CM | POA: Diagnosis not present

## 2021-12-28 DIAGNOSIS — H6120 Impacted cerumen, unspecified ear: Secondary | ICD-10-CM | POA: Diagnosis not present

## 2021-12-28 DIAGNOSIS — E785 Hyperlipidemia, unspecified: Secondary | ICD-10-CM | POA: Diagnosis not present

## 2021-12-28 DIAGNOSIS — Z0001 Encounter for general adult medical examination with abnormal findings: Secondary | ICD-10-CM | POA: Diagnosis not present

## 2022-01-10 DIAGNOSIS — E041 Nontoxic single thyroid nodule: Secondary | ICD-10-CM | POA: Diagnosis not present

## 2022-01-10 DIAGNOSIS — E559 Vitamin D deficiency, unspecified: Secondary | ICD-10-CM | POA: Diagnosis not present

## 2022-01-10 DIAGNOSIS — R413 Other amnesia: Secondary | ICD-10-CM | POA: Diagnosis not present

## 2022-01-10 DIAGNOSIS — Z681 Body mass index (BMI) 19 or less, adult: Secondary | ICD-10-CM | POA: Diagnosis not present

## 2022-01-10 DIAGNOSIS — E0590 Thyrotoxicosis, unspecified without thyrotoxic crisis or storm: Secondary | ICD-10-CM | POA: Diagnosis not present

## 2022-01-13 ENCOUNTER — Other Ambulatory Visit: Payer: Self-pay | Admitting: Internal Medicine

## 2022-01-13 DIAGNOSIS — E041 Nontoxic single thyroid nodule: Secondary | ICD-10-CM

## 2022-02-03 ENCOUNTER — Ambulatory Visit
Admission: RE | Admit: 2022-02-03 | Discharge: 2022-02-03 | Disposition: A | Discharge status: Home / Self Care | Payer: Medicare Other | Source: Ambulatory Visit | Attending: Internal Medicine | Admitting: Internal Medicine

## 2022-02-03 DIAGNOSIS — E041 Nontoxic single thyroid nodule: Secondary | ICD-10-CM

## 2022-02-03 DIAGNOSIS — E042 Nontoxic multinodular goiter: Secondary | ICD-10-CM | POA: Diagnosis not present

## 2022-02-07 DIAGNOSIS — M79671 Pain in right foot: Secondary | ICD-10-CM | POA: Diagnosis not present

## 2022-02-07 DIAGNOSIS — I739 Peripheral vascular disease, unspecified: Secondary | ICD-10-CM | POA: Diagnosis not present

## 2022-02-07 DIAGNOSIS — M79672 Pain in left foot: Secondary | ICD-10-CM | POA: Diagnosis not present

## 2022-02-09 ENCOUNTER — Other Ambulatory Visit: Payer: Self-pay | Admitting: Internal Medicine

## 2022-02-09 DIAGNOSIS — E042 Nontoxic multinodular goiter: Secondary | ICD-10-CM

## 2022-02-16 ENCOUNTER — Ambulatory Visit
Admission: RE | Admit: 2022-02-16 | Discharge: 2022-02-16 | Disposition: A | Discharge status: Home / Self Care | Payer: Medicare Other | Source: Ambulatory Visit | Attending: Internal Medicine | Admitting: Internal Medicine

## 2022-02-16 DIAGNOSIS — E041 Nontoxic single thyroid nodule: Secondary | ICD-10-CM | POA: Diagnosis not present

## 2022-02-16 DIAGNOSIS — E042 Nontoxic multinodular goiter: Secondary | ICD-10-CM

## 2022-02-17 ENCOUNTER — Other Ambulatory Visit (HOSPITAL_COMMUNITY): Payer: Self-pay | Admitting: Internal Medicine

## 2022-02-17 DIAGNOSIS — E042 Nontoxic multinodular goiter: Secondary | ICD-10-CM

## 2022-02-22 ENCOUNTER — Other Ambulatory Visit: Payer: Self-pay | Admitting: Radiology

## 2022-02-23 ENCOUNTER — Ambulatory Visit (HOSPITAL_COMMUNITY)
Admission: RE | Admit: 2022-02-23 | Discharge: 2022-02-23 | Disposition: A | Discharge status: Home / Self Care | Payer: Medicare Other | Source: Ambulatory Visit | Attending: Internal Medicine | Admitting: Internal Medicine

## 2022-02-23 ENCOUNTER — Other Ambulatory Visit: Payer: Self-pay

## 2022-02-23 VITALS — BP 109/64 | HR 73 | Temp 98.0°F | Resp 15 | Ht 62.0 in | Wt 90.0 lb

## 2022-02-23 DIAGNOSIS — E042 Nontoxic multinodular goiter: Secondary | ICD-10-CM | POA: Diagnosis not present

## 2022-02-23 DIAGNOSIS — E0789 Other specified disorders of thyroid: Secondary | ICD-10-CM | POA: Diagnosis not present

## 2022-02-23 MED ORDER — SODIUM CHLORIDE 0.9 % IV SOLN: INTRAVENOUS | Status: DC

## 2022-02-23 MED ORDER — FENTANYL CITRATE (PF) 100 MCG/2ML IJ SOLN
INTRAMUSCULAR | Status: AC | PRN
  Administered 2022-02-23: 25 ug via INTRAVENOUS

## 2022-02-23 MED ORDER — LIDOCAINE HCL (PF) 1 % IJ SOLN
INTRAMUSCULAR | Status: AC
  Filled 2022-02-23: qty 30

## 2022-02-23 MED ORDER — MIDAZOLAM HCL 2 MG/2ML IJ SOLN
INTRAMUSCULAR | Status: AC
  Filled 2022-02-23: qty 2

## 2022-02-23 MED ORDER — MIDAZOLAM HCL 2 MG/2ML IJ SOLN
INTRAMUSCULAR | Status: AC | PRN
  Administered 2022-02-23: 1 mg via INTRAVENOUS

## 2022-02-23 MED ORDER — FENTANYL CITRATE (PF) 100 MCG/2ML IJ SOLN
INTRAMUSCULAR | Status: AC
  Filled 2022-02-23: qty 2

## 2022-02-23 NOTE — H&P (Signed)
Chief Complaint: Patient was seen in consultation today for right and left thyroid nodule biopsies at the request of Averneni,Madhavi  Referring Physician(s): Averneni,Madhavi  Supervising Physician: Pernell DupreEl-Abd, Yasser J  [facility]  History of Present Illness: Laura Young is a 87 y.o. female   Bilateral thyroid nodules US 02/03/22: IMPRESSION: 1. Enlarged markedly heterogeneous multinodular thyroid gland. 2. Bilateral thyroid nodules as described meet criteria for biopsy  Biopsy was attempted 6/8 at OP Clinic Pt was unable to tolerate biopsy - see dictation  Rescheduled today for hospital setting for procedure using conscience sedation per ordering MD   Past Medical History:  Diagnosis Date   Hypothyroidism    goiter    Past Surgical History:  Procedure Laterality Date   ABDOMINAL HYSTERECTOMY     CHOLECYSTECTOMY      Allergies: Patient has no known allergies.  Medications: Prior to Admission medications   Medication Sig Start Date End Date Taking? Authorizing Provider  methimazole (TAPAZOLE) 5 MG tablet Take 5 mg by mouth 2 (two) times a week. Tuesdays and Saturdays   Yes [provider]  Vitamin D, Ergocalciferol, (DRISDOL) 1.25 MG (50000 UNIT) CAPS capsule Take 50,000 Units by mouth every Tuesday. 01/03/22  Yes [provider]     Family History  Family history unknown: Yes    Social History   Socioeconomic History   Marital status: Married    Spouse name: Not on file   Number of children: Not on file   Years of education: Not on file   Highest education level: Not on file  Occupational History   Not on file  Tobacco Use   Smoking status: Never   Smokeless tobacco: Never  Vaping Use   Vaping Use: Not on file  Substance and Sexual Activity   Alcohol use: Never   Drug use: Never   Sexual activity: Not on file  Other Topics Concern   Not on file  Social History Narrative   Not on file   Social  Determinants of Health   Financial Resource Strain: Not on file  Food Insecurity: Not on file  Transportation Needs: Not on file  Physical Activity: Not on file  Stress: Not on file  Social Connections: Not on file    Review of Systems: A 12 point ROS discussed and pertinent positives are indicated in the HPI above.  All other systems are negative.    Vital Signs: BP (!) 122/58   Pulse 76   Temp 98 F (36.7 C) (Oral)   Resp 16   Ht 5\' 2"  (1.575 m)   Wt 90 lb (40.8 kg)   SpO2 98%   BMI 16.46 kg/m   Physical Exam Vitals reviewed.  HENT:     Mouth/Throat:     Mouth: Mucous membranes are moist.  Cardiovascular:     Rate and Rhythm: Normal rate and regular rhythm.     Heart sounds: Murmur heard.  Pulmonary:     Effort: Pulmonary effort is normal.     Breath sounds: Normal breath sounds.  Abdominal:     Palpations: Abdomen is soft.  Musculoskeletal:        General: Normal range of motion.  Skin:    General: Skin is warm.  Neurological:     Mental Status: She is alert. Mental status is at baseline.  Psychiatric:     Comments: Pt with dementia Daughter at bedside consents to procedure     Imaging: US FNA BX THYROID  1ST LESION AFIRMA  Result Date: 02/17/2022 INDICATION: Bilateral thyroid nodules Right inferior nodule: 7.1 cm Left inferior thyroid nodule: 2.8 cm EXAM: Bilateral thyroid biopsy DISCONTINUED COMPARISON:  US Thyroid 02/03/22. MEDICATIONS: None COMPLICATIONS: None immediate. TECHNIQUE: Biopsy not performed secondary pt was unable to tolerate procedure. Unable to hold still in procedure. Sterile field compromised more than once. IMPRESSION: Bilateral thyroid biopsy discontinued. Not performed today. Patient discharged home with Son in law. Dr Gilmer Mor aware and involved in decision making. Robet Leu [facility] Electronically Signed   By: Gilmer Mor D.O.   On: 02/17/2022 10:03   [place] FNA BX THYROID 1ST LESION AFIRMA  Result Date: 02/17/2022 INDICATION:  Bilateral thyroid nodules Right inferior nodule: 7.1 cm Left inferior thyroid nodule: 2.8 cm EXAM: Bilateral thyroid biopsy DISCONTINUED COMPARISON:  US Thyroid 02/03/22. MEDICATIONS: None COMPLICATIONS: None immediate. TECHNIQUE: Biopsy not performed secondary pt was unable to tolerate procedure. Unable to hold still in procedure. Sterile field compromised more than once. IMPRESSION: Bilateral thyroid biopsy discontinued. Not performed today. Patient discharged home with Son in law. Dr Gilmer Mor aware and involved in decision making. Robet Leu [facility] Electronically Signed   By: Gilmer Mor D.O.   On: 02/17/2022 10:03   US THYROID  Result Date: 02/04/2022 CLINICAL DATA:  Prior ultrasound follow-up. EXAM: THYROID ULTRASOUND TECHNIQUE: Ultrasound examination of the thyroid gland and adjacent soft tissues was performed. COMPARISON:  2016 FINDINGS: Parenchymal Echotexture: Markedly heterogenous Isthmus: 4.6 cm Right lobe: 8.9 x 5.2 x 6.1 cm Left lobe: 7.1 x 3.6 x 4.6 cm _________________________________________________________ Estimated total number of nodules >/= 1 cm: 2 Number of spongiform nodules >/=  2 cm not described below (TR1): 0 Number of mixed cystic and solid nodules >/= 1.5 cm not described below (TR2): 0 _________________________________________________________ Nodule labeled 1 is a solid isoechoic nodule with macro calcification (TR 4) in the superior right thyroid lobe measuring 1.9 x 1.5 x 1.0 cm, previously 2.2 cm in 2016. Given similar to slight decrease in size over a period of greater than 5 years, this nodule is considered benign. No further follow-up or biopsy recommended. Nodule labeled 2 is a large solid isoechoic TR 3 nodule in the mid to inferior right thyroid lobe measuring 7.1 x 6.7 x 5.8 cm, previously 6.3 cm in 2016. This nodule has demonstrated slow interval growth. **Given size (>/= 2.5 cm) and appearance, fine needle aspiration of this mildly suspicious nodule should be  considered based on TI-RADS criteria. Markedly heterogeneous left thyroid lobe makes identification of discrete nodules challenging. At least 1 solid isoechoic TR 3 nodule measuring 2.8 x 2.5 cm is identified (see series 5, image 207). **Given size (>/= 2.5 cm) and appearance, fine needle aspiration of this mildly suspicious nodule should be considered based on TI-RADS criteria. IMPRESSION: 1. Enlarged markedly heterogeneous multinodular thyroid gland. 2. Bilateral thyroid nodules as described meet criteria for biopsy. Note that marked heterogeneity of the left thyroid lobe makes identification of discrete thyroid nodules challenging. The above is in keeping with the ACR TI-RADS recommendations - J Am Coll Radiol 2017;14:587-595. Electronically Signed   By: Olive Bass M.D.   On: 02/04/2022 09:46    Labs:  CBC: No results for input(s): "WBC", "HGB", "HCT", "PLT" in the last 8760 hours.  COAGS: No results for input(s): "INR", "APTT" in the last 8760 hours.  BMP: No results for input(s): "NA", "K", "CL", "CO2", "GLUCOSE", "BUN", "CALCIUM", "CREATININE", "GFRNONAA", "GFRAA" in the last 8760 hours.  Invalid input(s): "CMP"  LIVER FUNCTION TESTS: No results for input(s): "BILITOT", "AST", "ALT", "ALKPHOS", "PROT", "ALBUMIN" in the last 8760 hours.  TUMOR MARKERS: No results for input(s): "AFPTM", "CEA", "CA199", "CHROMGRNA" in the last 8760 hours.  Assessment and Plan:  Bilateral thyroid nodules- meets criteria for biopsy per Radiologist. Scheduled for procedure with conscience sedation Risks and benefits of Bilateral thyroid nodule biopsy was discussed with the patient and/or patient's family including, but not limited to bleeding, infection, damage to adjacent structures or low yield requiring additional tests.  All of the questions were answered and there is agreement to proceed.  Consent signed and in chart.   Thank you for this interesting consult.  I greatly enjoyed meeting Laura Young and look forward to participating in their care.  A copy of this report was sent to the requesting provider on this date.  Electronically Signed: Robet Leu, PA-C 02/23/2022, 12:31 PM   I spent a total of    25 Minutes in face to face in clinical consultation, greater than 50% of which was counseling/coordinating care for Bilateral thyroid nodule biopsy

## 2022-02-23 NOTE — Procedures (Addendum)
Interventional Radiology Procedure Note  Date of Procedure: 02/23/2022  Procedure: Thyroid Biopsy, bilateral   Findings:  1. US thyroid biopsy right nodule  2. US thyroid biopsy left nodule    Complications: No immediate complications noted.   Estimated Blood Loss: minimal  Follow-up and Recommendations: 1. None    Olive Bass, MD  Vascular & Interventional Radiology  02/23/2022 1:52 PM

## 2022-02-24 LAB — CYTOLOGY - NON PAP

## 2022-03-01 DIAGNOSIS — E042 Nontoxic multinodular goiter: Secondary | ICD-10-CM | POA: Diagnosis not present

## 2022-03-16 DIAGNOSIS — E559 Vitamin D deficiency, unspecified: Secondary | ICD-10-CM | POA: Diagnosis not present

## 2022-03-16 DIAGNOSIS — R627 Adult failure to thrive: Secondary | ICD-10-CM | POA: Diagnosis not present

## 2022-03-16 DIAGNOSIS — E0590 Thyrotoxicosis, unspecified without thyrotoxic crisis or storm: Secondary | ICD-10-CM | POA: Diagnosis not present

## 2022-03-16 DIAGNOSIS — Z8639 Personal history of other endocrine, nutritional and metabolic disease: Secondary | ICD-10-CM | POA: Diagnosis not present

## 2022-04-18 DIAGNOSIS — E0590 Thyrotoxicosis, unspecified without thyrotoxic crisis or storm: Secondary | ICD-10-CM | POA: Diagnosis not present

## 2022-04-18 DIAGNOSIS — E042 Nontoxic multinodular goiter: Secondary | ICD-10-CM | POA: Diagnosis not present

## 2022-07-19 DIAGNOSIS — E559 Vitamin D deficiency, unspecified: Secondary | ICD-10-CM | POA: Diagnosis not present

## 2022-07-19 DIAGNOSIS — E0590 Thyrotoxicosis, unspecified without thyrotoxic crisis or storm: Secondary | ICD-10-CM | POA: Diagnosis not present

## 2022-08-07 DIAGNOSIS — E0590 Thyrotoxicosis, unspecified without thyrotoxic crisis or storm: Secondary | ICD-10-CM | POA: Diagnosis not present

## 2022-08-15 DIAGNOSIS — E042 Nontoxic multinodular goiter: Secondary | ICD-10-CM | POA: Diagnosis not present

## 2022-08-15 DIAGNOSIS — E0590 Thyrotoxicosis, unspecified without thyrotoxic crisis or storm: Secondary | ICD-10-CM | POA: Diagnosis not present

## 2022-09-26 DIAGNOSIS — E559 Vitamin D deficiency, unspecified: Secondary | ICD-10-CM | POA: Diagnosis not present

## 2022-09-26 DIAGNOSIS — E0590 Thyrotoxicosis, unspecified without thyrotoxic crisis or storm: Secondary | ICD-10-CM | POA: Diagnosis not present

## 2022-09-26 DIAGNOSIS — Z0001 Encounter for general adult medical examination with abnormal findings: Secondary | ICD-10-CM | POA: Diagnosis not present

## 2022-11-14 DIAGNOSIS — E0590 Thyrotoxicosis, unspecified without thyrotoxic crisis or storm: Secondary | ICD-10-CM | POA: Diagnosis not present

## 2022-11-21 DIAGNOSIS — E0590 Thyrotoxicosis, unspecified without thyrotoxic crisis or storm: Secondary | ICD-10-CM | POA: Diagnosis not present

## 2022-11-26 ENCOUNTER — Other Ambulatory Visit: Payer: Self-pay

## 2022-11-26 ENCOUNTER — Encounter (HOSPITAL_COMMUNITY): Payer: Self-pay

## 2022-11-26 ENCOUNTER — Emergency Department (HOSPITAL_COMMUNITY): Payer: Medicare Other

## 2022-11-26 ENCOUNTER — Emergency Department (HOSPITAL_COMMUNITY)
Admission: EM | Admit: 2022-11-26 | Discharge: 2022-11-26 | Disposition: A | Discharge status: Home / Self Care | Payer: Medicare Other | Attending: Emergency Medicine | Admitting: Emergency Medicine

## 2022-11-26 DIAGNOSIS — R531 Weakness: Secondary | ICD-10-CM | POA: Diagnosis not present

## 2022-11-26 DIAGNOSIS — R5383 Other fatigue: Secondary | ICD-10-CM | POA: Diagnosis present

## 2022-11-26 DIAGNOSIS — U071 COVID-19: Secondary | ICD-10-CM | POA: Diagnosis not present

## 2022-11-26 DIAGNOSIS — R509 Fever, unspecified: Secondary | ICD-10-CM | POA: Diagnosis not present

## 2022-11-26 LAB — CBC WITH DIFFERENTIAL/PLATELET
Abs Immature Granulocytes: 0.03 10*3/uL (ref 0.00–0.07)
Basophils Absolute: 0.1 10*3/uL (ref 0.0–0.1)
Basophils Relative: 1 %
Eosinophils Absolute: 0 10*3/uL (ref 0.0–0.5)
Eosinophils Relative: 0 %
HCT: 37.6 % (ref 36.0–46.0)
Hemoglobin: 12.2 g/dL (ref 12.0–15.0)
Immature Granulocytes: 0 %
Lymphocytes Relative: 3 %
Lymphs Abs: 0.2 10*3/uL — ABNORMAL LOW (ref 0.7–4.0)
MCH: 28.6 pg (ref 26.0–34.0)
MCHC: 32.4 g/dL (ref 30.0–36.0)
MCV: 88.3 fL (ref 80.0–100.0)
Monocytes Absolute: 0.6 10*3/uL (ref 0.1–1.0)
Monocytes Relative: 9 %
Neutro Abs: 6.2 10*3/uL (ref 1.7–7.7)
Neutrophils Relative %: 87 %
Platelets: 213 10*3/uL (ref 150–400)
RBC: 4.26 MIL/uL (ref 3.87–5.11)
RDW: 14.1 % (ref 11.5–15.5)
WBC: 7.2 10*3/uL (ref 4.0–10.5)
nRBC: 0 % (ref 0.0–0.2)

## 2022-11-26 LAB — COMPREHENSIVE METABOLIC PANEL
ALT: 8 U/L (ref 0–44)
AST: 14 U/L — ABNORMAL LOW (ref 15–41)
Albumin: 3.7 g/dL (ref 3.5–5.0)
Alkaline Phosphatase: 50 U/L (ref 38–126)
Anion gap: 8 (ref 5–15)
BUN: 14 mg/dL (ref 8–23)
CO2: 25 mmol/L (ref 22–32)
Calcium: 8.6 mg/dL — ABNORMAL LOW (ref 8.9–10.3)
Chloride: 99 mmol/L (ref 98–111)
Creatinine, Ser: 0.83 mg/dL (ref 0.44–1.00)
GFR, Estimated: >60 mL/min (ref >60)
Glucose, Bld: 109 mg/dL — ABNORMAL HIGH (ref 70–99)
Potassium: 3.9 mmol/L (ref 3.5–5.1)
Sodium: 132 mmol/L — ABNORMAL LOW (ref 135–145)
Total Bilirubin: 1.1 mg/dL (ref 0.3–1.2)
Total Protein: 7.1 g/dL (ref 6.5–8.1)

## 2022-11-26 LAB — URINALYSIS, W/ REFLEX TO CULTURE (INFECTION SUSPECTED)
Bilirubin Urine: NEGATIVE
Glucose, UA: NEGATIVE mg/dL
Ketones, ur: 20 mg/dL — AB
Nitrite: NEGATIVE
Protein, ur: 30 mg/dL — AB
Specific Gravity, Urine: 1.024 (ref 1.005–1.030)
pH: 5 (ref 5.0–8.0)

## 2022-11-26 LAB — RESP PANEL BY RT-PCR (RSV, FLU A&B, COVID)  RVPGX2
Influenza A by PCR: NEGATIVE
Influenza B by PCR: NEGATIVE
Resp Syncytial Virus by PCR: NEGATIVE
SARS Coronavirus 2 by RT PCR: POSITIVE — AB

## 2022-11-26 LAB — PROTIME-INR
INR: 1.1 (ref 0.8–1.2)
Prothrombin Time: 13.6 seconds (ref 11.4–15.2)

## 2022-11-26 LAB — LACTIC ACID, PLASMA: Lactic Acid, Venous: 1.2 mmol/L (ref 0.5–1.9)

## 2022-11-26 LAB — APTT: aPTT: 28 seconds (ref 24–36)

## 2022-11-26 MED ORDER — LACTATED RINGERS IV SOLN: INTRAVENOUS | Status: DC

## 2022-11-26 MED ORDER — SODIUM CHLORIDE 0.9 % IV SOLN
2.0000 g | INTRAVENOUS | Status: DC
  Administered 2022-11-26: 2 g via INTRAVENOUS
  Filled 2022-11-26: qty 20

## 2022-11-26 MED ORDER — PAXLOVID (300/100) 20 X 150 MG & 10 X 100MG PO TBPK: 3.0000 | ORAL_TABLET | Freq: Two times a day (BID) | ORAL | 0 refills | Status: AC

## 2022-11-26 MED ORDER — ACETAMINOPHEN 500 MG PO TABS
1000.0000 mg | ORAL_TABLET | Freq: Once | ORAL | Status: AC
  Administered 2022-11-26: 1000 mg via ORAL
  Filled 2022-11-26: qty 2

## 2022-11-26 NOTE — Discharge Instructions (Addendum)
Since your husband likely has COVID as well as your mother I would have him take care of her for the next few days.  She will be weak, she will have intermittent fevers, she may start coughing runny nose or sore throat.  You can alternate Tylenol and ibuprofen for fever, make sure she continues to take her home medications such as methimazole, I have prescribed the COVID medication called Paxlovid which she needs to take for the next 5 days.  Please make sure she is drinking plenty of clear liquids  Please return to the emergency department for severe or worsening symptoms

## 2022-11-26 NOTE — Sepsis Progress Note (Signed)
Notified provider of need to order fluid bolus of 1218ml per sepsis protocol for 2 MAPS < 65 if appropriate for this patient.Marland Kitchen

## 2022-11-26 NOTE — Sepsis Progress Note (Signed)
Sepsis protocol is being followed by [facility]. 

## 2022-11-26 NOTE — ED Triage Notes (Signed)
Pt has not been acting her normal today, will normally get up and walk, eat her meals ect. Today she seems sleepy and will not eat. Does have a fever this am.

## 2022-11-26 NOTE — ED Provider Notes (Signed)
[facility] Provider Note   CSN: [id number] Arrival date & time: 11/26/22  1525     History  Chief Complaint  Patient presents with   Fatigue    Laura Young is a 88 y.o. female.  HPI   This patient is an 88 year old female, history of hyperthyroidism on methimazole, no other daily medications, she also has a history of dementia, the daughter is the primary historian reporting that the patient has been abnormal this morning, she has been altered, confused, very sleepy, difficult to arouse and needed significant assistance walking from her bedroom out to the main area.  She is not able to give [place] any information due to her dementia, essentially nonverbal, level 5 caveat applies.  Home Medications Prior to Admission medications   Medication Sig Start Date End Date Taking? Authorizing Provider  nirmatrelvir & ritonavir (PAXLOVID, 300/100,) 20 x 150 MG & 10 x 100MG  TBPK Take 3 tablets by mouth 2 (two) times daily for 5 days. 11/26/22 12/01/22 Yes Noemi Chapel, MD  methimazole (TAPAZOLE) 5 MG tablet Take 5 mg by mouth 2 (two) times a week. Tuesdays and Saturdays    [provider]  Vitamin D, Ergocalciferol, (DRISDOL) 1.25 MG (50000 UNIT) CAPS capsule Take 50,000 Units by mouth every Tuesday. 01/03/22   [provider]      Allergies    Patient has no known allergies.    Review of Systems   Review of Systems  All other systems reviewed and are negative.   Physical Exam Updated Vital Signs BP (!) 118/43   Pulse 94   Temp (!) 103 F (39.4 C) (Oral) Comment: Simultaneous filing. User may not have seen previous data. Comment (Src): Simultaneous filing. User may not have seen previous data.  Resp 14   Ht 1.549 m (5\' 1" )   Wt 41.7 kg   SpO2 94%   BMI 17.38 kg/m  Physical Exam Vitals and nursing note reviewed.  Constitutional:      General: She is not in acute distress.    Appearance: She is well-developed.   HENT:     Head: Normocephalic and atraumatic.     Mouth/Throat:     Pharynx: No oropharyngeal exudate.  Eyes:     General: No scleral icterus.       Right eye: No discharge.        Left eye: No discharge.     Conjunctiva/sclera: Conjunctivae normal.     Pupils: Pupils are equal, round, and reactive to light.  Neck:     Thyroid: No thyromegaly.     Vascular: No JVD.  Cardiovascular:     Rate and Rhythm: Normal rate and regular rhythm.     Heart sounds: Normal heart sounds. No murmur heard.    No friction rub. No gallop.  Pulmonary:     Effort: Pulmonary effort is normal. No respiratory distress.     Breath sounds: Normal breath sounds. No wheezing or rales.  Abdominal:     General: Bowel sounds are normal. There is no distension.     Palpations: Abdomen is soft. There is no mass.     Tenderness: There is no abdominal tenderness.  Musculoskeletal:        General: No tenderness. Normal range of motion.     Cervical back: Normal range of motion and neck supple.     Right lower leg: No edema.     Left lower leg: No edema.  Lymphadenopathy:  Cervical: No cervical adenopathy.  Skin:    General: Skin is warm and dry.     Findings: No erythema or rash.  Neurological:     Mental Status: She is alert.     Coordination: Coordination normal.     Comments: The patient is somnolent but arousable, moves all 4 extremities to painful stimuli but does not voluntarily move either leg.  She will grip both of my hands with her is equally weakly, there is no cranial nerve deficits that are visible including facial droop.  She is not talking at this time so difficult to examine her speech  Psychiatric:        Behavior: Behavior normal.     ED Results / Procedures / Treatments   Labs (all labs ordered are listed, but only abnormal results are displayed) Labs Reviewed  RESP PANEL BY RT-PCR (RSV, FLU A&B, COVID)  RVPGX2 - Abnormal; Notable for the following components:      Result Value    SARS Coronavirus 2 by RT PCR POSITIVE (*)    All other components within normal limits  COMPREHENSIVE METABOLIC PANEL - Abnormal; Notable for the following components:   Sodium 132 (*)    Glucose, Bld 109 (*)    Calcium 8.6 (*)    AST 14 (*)    All other components within normal limits  CBC WITH DIFFERENTIAL/PLATELET - Abnormal; Notable for the following components:   Lymphs Abs 0.2 (*)    All other components within normal limits  URINALYSIS, W/ REFLEX TO CULTURE (INFECTION SUSPECTED) - Abnormal; Notable for the following components:   Color, Urine AMBER (*)    APPearance HAZY (*)    Hgb urine dipstick LARGE (*)    Ketones, ur 20 (*)    Protein, ur 30 (*)    Leukocytes,Ua SMALL (*)    Bacteria, UA FEW (*)    All other components within normal limits  CULTURE, BLOOD (ROUTINE X 2)  CULTURE, BLOOD (ROUTINE X 2)  URINE CULTURE  LACTIC ACID, PLASMA  PROTIME-INR  APTT  LACTIC ACID, PLASMA    EKG EKG Interpretation  Date/Time:  Sunday November 26 2022 16:28:09 EDT Ventricular Rate:  89 PR Interval:  167 QRS Duration: 81 QT Interval:  350 QTC Calculation: 426 R Axis:   52 Text Interpretation: Sinus rhythm Atrial premature complex Confirmed by Noemi Chapel [phone]) on 11/26/2022 4:48:45 PM  Radiology DG Chest Port 1 View  Result Date: 11/26/2022 CLINICAL DATA:  Fever altered EXAM: PORTABLE CHEST 1 VIEW COMPARISON:  CT 10/16/2018, thyroid ultrasound 02/03/2022 FINDINGS: Right lung is grossly clear. Streaky left retrocardiac opacity. No pleural effusion or pneumothorax. Right paratracheal opacity corresponding to enlarged thyroid/thyroid masses. IMPRESSION: 1. Streaky left retrocardiac opacity, atelectasis versus pneumonia. 2. Right paratracheal opacity corresponding to enlarged thyroid/thyroid masses on previous thyroid imaging Electronically Signed   By: Donavan Foil M.D.   On: 11/26/2022 16:42    Procedures Procedures    Medications Ordered in ED Medications  lactated  ringers infusion (has no administration in time range)  cefTRIAXone (ROCEPHIN) 2 g in sodium chloride 0.9 % 100 mL IVPB (0 g Intravenous Stopped 11/26/22 1700)  acetaminophen (TYLENOL) tablet 1,000 mg (has no administration in time range)    ED Course/ Medical Decision Making/ A&P                             Medical Decision Making Amount and/or Complexity of Data Reviewed Labs:  ordered. Radiology: ordered. ECG/medicine tests: ordered.  Risk OTC drugs. Prescription drug management.   This patient presents to the ED for concern of generalized weakness, this involves an extensive number of treatment options, and is a complaint that carries with it a high risk of complications and morbidity.  The differential diagnosis includes sepsis, COVID, flu, pneumonia, UTI   Co morbidities that complicate the patient evaluation  Dementia   Additional history obtained:  Additional history obtained from family member External records from outside source obtained and reviewed including multiple visits for thyrotoxicosis to the office last seen March 12, no recent admissions to the hospital   Lab Tests:  I Ordered, and personally interpreted labs.  The pertinent results include: Sepsis order set labs unremarkable, COVID-positive   Imaging Studies ordered:  I ordered imaging studies including chest x-ray I independently visualized and interpreted imaging which showed no acute infiltrates I agree with the radiologist interpretation   Cardiac Monitoring: / EKG:  The patient was maintained on a cardiac monitor.  I personally viewed and interpreted the cardiac monitored which showed an underlying rhythm of: Normal sinus rhythm, no tachycardia   Problem List / ED Course / Critical interventions / Medication management  The patient was given Tylenol for fever, she is not vomiting or coughing, x-rays look good, family member was informed of the results and the treatment plan.  She states  that her husband has similar symptoms and likely has the COVID as well.  He will be the one that takes care of her for the next few days, they understand the indications for return I ordered medication including Paxlovid for home Reevaluation of the patient after these medicines showed that the patient stable I have reviewed the patients home medicines and have made adjustments as needed   Social Determinants of Health:  Elderly   Test / Admission - Considered:  Stable for discharge         Final Clinical Impression(s) / ED Diagnoses Final diagnoses:  COVID-19    Rx / DC Orders ED Discharge Orders          Ordered    nirmatrelvir & ritonavir (PAXLOVID, 300/100,) 20 x 150 MG & 10 x 100MG  TBPK  2 times daily        11/26/22 1825              Noemi Chapel, MD 11/26/22 1827

## 2022-11-29 ENCOUNTER — Telehealth: Payer: Self-pay

## 2022-11-29 LAB — URINE CULTURE: Culture: >=100000 — AB

## 2022-11-29 NOTE — Telephone Encounter (Signed)
        Patient  visited [facility] on 11/26/2022  for COVID-19, fatigue.   Telephone encounter attempt :  1st  A HIPAA compliant voice message was left requesting a return call.  Instructed patient to call back at [phone].   [facility] Resource Care Guide   [email or web address]  ?? [id number]   Website: [email or web address]  [email or web address]

## 2022-11-30 ENCOUNTER — Telehealth (HOSPITAL_BASED_OUTPATIENT_CLINIC_OR_DEPARTMENT_OTHER): Payer: Self-pay | Admitting: *Deleted

## 2022-11-30 ENCOUNTER — Telehealth: Payer: Self-pay

## 2022-11-30 NOTE — Progress Notes (Signed)
ED Antimicrobial Stewardship Positive Culture Follow Up   Laura Young is an 88 y.o. female who presented to [facility] on 11/26/2022 with a chief complaint of  Chief Complaint  Patient presents with   Fatigue    Recent Results (from the past 720 hour(s))  Blood Culture (routine x 2)     Status: None (Preliminary result)   Collection Time: 11/26/22  4:03 PM   Specimen: Right Antecubital; Blood  Result Value Ref Range Status   Specimen Description   Final    RIGHT ANTECUBITAL BOTTLES DRAWN AEROBIC AND ANAEROBIC   Special Requests Blood Culture adequate volume  Final   Culture   Final    NO GROWTH 4 DAYS Performed at [facility], [address], [place], [place] [address]    Report Status PENDING  Incomplete  Resp panel by RT-PCR (RSV, Flu A&B, Covid) Anterior Nasal Swab     Status: Abnormal   Collection Time: 11/26/22  4:05 PM   Specimen: Anterior Nasal Swab  Result Value Ref Range Status   SARS Coronavirus 2 by RT PCR POSITIVE (A) NEGATIVE Final    Comment: (NOTE) SARS-CoV-2 target nucleic acids are DETECTED.  The SARS-CoV-2 RNA is generally detectable in upper respiratory specimens during the acute phase of infection. Positive results are indicative of the presence of the identified virus, but do not rule out bacterial infection or co-infection with other pathogens not detected by the test. Clinical correlation with patient history and other diagnostic information is necessary to determine patient infection status. The expected result is Negative.  Fact Sheet for Patients: EntrepreneurPulse.com.au  Fact Sheet for Healthcare Providers: IncredibleEmployment.be  This test is not yet approved or cleared by the [place] FDA and  has been authorized for detection and/or diagnosis of SARS-CoV-2 by FDA under an Emergency Use Authorization (EUA).  This EUA will remain in effect (meaning this test can be used) for the duration of  the  COVID-19 declaration under Section 564(b)(1) of the A ct, 21 U.S.C. section 360bbb-3(b)(1), unless the authorization is terminated or revoked sooner.     Influenza A by PCR NEGATIVE NEGATIVE Final   Influenza B by PCR NEGATIVE NEGATIVE Final    Comment: (NOTE) The Xpert Xpress SARS-CoV-2/FLU/RSV plus assay is intended as an aid in the diagnosis of influenza from Nasopharyngeal swab specimens and should not be used as a sole basis for treatment. Nasal washings and aspirates are unacceptable for Xpert Xpress SARS-CoV-2/FLU/RSV testing.  Fact Sheet for Patients: EntrepreneurPulse.com.au  Fact Sheet for Healthcare Providers: IncredibleEmployment.be  This test is not yet approved or cleared by the [place] FDA and has been authorized for detection and/or diagnosis of SARS-CoV-2 by FDA under an Emergency Use Authorization (EUA). This EUA will remain in effect (meaning this test can be used) for the duration of the COVID-19 declaration under Section 564(b)(1) of the Act, 21 U.S.C. section 360bbb-3(b)(1), unless the authorization is terminated or revoked.     Resp Syncytial Virus by PCR NEGATIVE NEGATIVE Final    Comment: (NOTE) Fact Sheet for Patients: EntrepreneurPulse.com.au  Fact Sheet for Healthcare Providers: IncredibleEmployment.be  This test is not yet approved or cleared by the [place] FDA and has been authorized for detection and/or diagnosis of SARS-CoV-2 by FDA under an Emergency Use Authorization (EUA). This EUA will remain in effect (meaning this test can be used) for the duration of the COVID-19 declaration under Section 564(b)(1) of the Act, 21 U.S.C. section 360bbb-3(b)(1), unless the authorization is terminated or  revoked.  Performed at [facility], [address], [place], [place] [address]   Blood Culture (routine x 2)     Status: None (Preliminary result)   Collection Time:  11/26/22  4:05 PM   Specimen: BLOOD RIGHT HAND  Result Value Ref Range Status   Specimen Description   Final    BLOOD RIGHT HAND BOTTLES DRAWN AEROBIC AND ANAEROBIC   Special Requests Blood Culture adequate volume  Final   Culture   Final    NO GROWTH 4 DAYS Performed at [facility], [address], [place], [place] [address]    Report Status PENDING  Incomplete  Urine Culture     Status: Abnormal   Collection Time: 11/26/22  4:05 PM   Specimen: Urine, Random  Result Value Ref Range Status   Specimen Description   Final    URINE, RANDOM Performed at [facility], [address], [place], [place] [address]    Special Requests   Final    NONE Reflexed from [id number] Performed at [facility], [address], [place],  [address]    Culture >=100,000 COLONIES/mL ESCHERICHIA COLI (A)  Final   Report Status 11/29/2022 FINAL  Final   Organism ID, Bacteria ESCHERICHIA COLI (A)  Final      Susceptibility   Escherichia coli - MIC*    AMPICILLIN 8 SENSITIVE Sensitive     CEFAZOLIN <=4 SENSITIVE Sensitive     CEFEPIME <=0.12 SENSITIVE Sensitive     CEFTRIAXONE <=0.25 SENSITIVE Sensitive     CIPROFLOXACIN <=0.25 SENSITIVE Sensitive     GENTAMICIN <=1 SENSITIVE Sensitive     IMIPENEM <=0.25 SENSITIVE Sensitive     NITROFURANTOIN <=16 SENSITIVE Sensitive     TRIMETH/SULFA <=20 SENSITIVE Sensitive     AMPICILLIN/SULBACTAM 4 SENSITIVE Sensitive     PIP/TAZO <=4 SENSITIVE Sensitive     * >=100,000 COLONIES/mL ESCHERICHIA COLI    Patient presented to ED on 3/17 for fatigue. Patient was found to be COVID positive and discharged with a prescription for Paxlovid. Patient's urine culture came back with >100,000 colonies/mL escherichia coli.   [x]  Patient discharged originally without antimicrobial agent and treatment is now indicated  New antibiotic prescription: Cephalexin 500 mg po BID x5 days  ED Provider: Ezequiel Essex, MD  Jeneen Rinks [id number], [id number] AM Clinical  Pharmacist Monday - Friday phone -  [phone] Saturday - Sunday phone - [phone]

## 2022-11-30 NOTE — Telephone Encounter (Signed)
Post ED Visit - Positive Culture Follow-up: Unsuccessful Patient Follow-up  Culture assessed and recommendations reviewed by:  []  Elenor Quinones, Pharm.D. []  Heide Guile, Pharm.D., BCPS AQ-ID []  Parks Neptune, Pharm.D., BCPS []  Alycia Rossetti, Pharm.D., BCPS []  [place], Pharm.D., BCPS, AAHIVP []  Legrand Como, Pharm.D., BCPS, AAHIVP []  Wynell Balloon, PharmD []  Vincenza Hews, PharmD, BCPS  Positive urine culture  [x]  Patient discharged without antimicrobial prescription and treatment is now indicated []  Organism is resistant to prescribed ED discharge antimicrobial []  Patient with positive blood cultures  Plan:  Cephalexin 500mg  PO BID x 5 days, Ezequiel Essex, MD   Unable to contact patient after 3 attempts, letter will be sent to address on file  Ardeen Fillers 11/30/2022, 10:59 AM

## 2022-11-30 NOTE — Telephone Encounter (Signed)
        Patient  visited [facility] on 11/26/2022  for COVID-19, fatigue.   Telephone encounter attempt :  2nd  No answer/busy unable to leave voicemail message.   [facility] Resource Care Guide   [email or web address]  ?? [id number]   Website: [email or web address]  [email or web address]

## 2022-12-01 ENCOUNTER — Telehealth: Payer: Self-pay

## 2022-12-01 LAB — CULTURE, BLOOD (ROUTINE X 2)
Culture: NO GROWTH
Culture: NO GROWTH
Special Requests: ADEQUATE
Special Requests: ADEQUATE

## 2022-12-01 NOTE — Telephone Encounter (Signed)
        Patient  visited [facility] on 11/26/2022  for COVID-19, fatigue.   Telephone encounter attempt :  3rd  A HIPAA compliant voice message was left requesting a return call.  Instructed patient to call back at No answer/busy unable to leave voicemail message.   [facility] Resource Care Guide   [email or web address]  ?? [phone]   Website: [email or web address]  [email or web address]

## 2022-12-08 ENCOUNTER — Telehealth (HOSPITAL_BASED_OUTPATIENT_CLINIC_OR_DEPARTMENT_OTHER): Payer: Self-pay | Admitting: *Deleted

## 2022-12-08 NOTE — Telephone Encounter (Signed)
Contacted by daughter of patient  in response to letter sent to address on file.  Cephalexin 500mg  BID x 5 days Ezequiel Essex) called to [employer], [place]

## 2023-01-05 DIAGNOSIS — Z681 Body mass index (BMI) 19 or less, adult: Secondary | ICD-10-CM | POA: Diagnosis not present

## 2023-01-05 DIAGNOSIS — E0590 Thyrotoxicosis, unspecified without thyrotoxic crisis or storm: Secondary | ICD-10-CM | POA: Diagnosis not present

## 2023-01-05 DIAGNOSIS — E049 Nontoxic goiter, unspecified: Secondary | ICD-10-CM | POA: Diagnosis not present

## 2023-01-05 DIAGNOSIS — Z0001 Encounter for general adult medical examination with abnormal findings: Secondary | ICD-10-CM | POA: Diagnosis not present

## 2023-02-21 DIAGNOSIS — E0590 Thyrotoxicosis, unspecified without thyrotoxic crisis or storm: Secondary | ICD-10-CM | POA: Diagnosis not present

## 2023-02-28 DIAGNOSIS — E0590 Thyrotoxicosis, unspecified without thyrotoxic crisis or storm: Secondary | ICD-10-CM | POA: Diagnosis not present

## 2023-02-28 DIAGNOSIS — E041 Nontoxic single thyroid nodule: Secondary | ICD-10-CM | POA: Diagnosis not present

## 2023-04-11 DIAGNOSIS — E0590 Thyrotoxicosis, unspecified without thyrotoxic crisis or storm: Secondary | ICD-10-CM | POA: Diagnosis not present

## 2023-07-04 DIAGNOSIS — E0590 Thyrotoxicosis, unspecified without thyrotoxic crisis or storm: Secondary | ICD-10-CM | POA: Diagnosis not present

## 2023-07-11 ENCOUNTER — Other Ambulatory Visit: Payer: Self-pay | Admitting: Nurse Practitioner

## 2023-07-11 DIAGNOSIS — E042 Nontoxic multinodular goiter: Secondary | ICD-10-CM

## 2023-07-11 DIAGNOSIS — E0590 Thyrotoxicosis, unspecified without thyrotoxic crisis or storm: Secondary | ICD-10-CM | POA: Diagnosis not present

## 2023-07-16 ENCOUNTER — Inpatient Hospital Stay
Admission: RE | Admit: 2023-07-16 | Discharge: 2023-07-16 | Discharge status: Home / Self Care | Payer: Medicare Other | Source: Ambulatory Visit | Attending: Nurse Practitioner

## 2023-07-16 DIAGNOSIS — E042 Nontoxic multinodular goiter: Secondary | ICD-10-CM | POA: Diagnosis not present

## 2023-08-16 DIAGNOSIS — E0590 Thyrotoxicosis, unspecified without thyrotoxic crisis or storm: Secondary | ICD-10-CM | POA: Diagnosis not present

## 2023-09-07 ENCOUNTER — Encounter (HOSPITAL_COMMUNITY): Payer: Self-pay

## 2023-09-07 ENCOUNTER — Other Ambulatory Visit: Payer: Self-pay

## 2023-09-07 DIAGNOSIS — W01198A Fall on same level from slipping, tripping and stumbling with subsequent striking against other object, initial encounter: Secondary | ICD-10-CM | POA: Insufficient documentation

## 2023-09-07 DIAGNOSIS — E039 Hypothyroidism, unspecified: Secondary | ICD-10-CM | POA: Diagnosis not present

## 2023-09-07 DIAGNOSIS — F0390 Unspecified dementia without behavioral disturbance: Secondary | ICD-10-CM | POA: Insufficient documentation

## 2023-09-07 DIAGNOSIS — S0511XA Contusion of eyeball and orbital tissues, right eye, initial encounter: Secondary | ICD-10-CM | POA: Diagnosis not present

## 2023-09-07 DIAGNOSIS — R040 Epistaxis: Secondary | ICD-10-CM | POA: Insufficient documentation

## 2023-09-07 DIAGNOSIS — S0083XA Contusion of other part of head, initial encounter: Secondary | ICD-10-CM | POA: Insufficient documentation

## 2023-09-07 DIAGNOSIS — S161XXA Strain of muscle, fascia and tendon at neck level, initial encounter: Secondary | ICD-10-CM | POA: Diagnosis not present

## 2023-09-07 DIAGNOSIS — S0990XA Unspecified injury of head, initial encounter: Secondary | ICD-10-CM | POA: Diagnosis not present

## 2023-09-07 DIAGNOSIS — S0240EA Zygomatic fracture, right side, initial encounter for closed fracture: Secondary | ICD-10-CM | POA: Diagnosis not present

## 2023-09-07 DIAGNOSIS — M47812 Spondylosis without myelopathy or radiculopathy, cervical region: Secondary | ICD-10-CM | POA: Diagnosis not present

## 2023-09-07 DIAGNOSIS — S0240CA Maxillary fracture, right side, initial encounter for closed fracture: Secondary | ICD-10-CM | POA: Diagnosis not present

## 2023-09-07 DIAGNOSIS — M7989 Other specified soft tissue disorders: Secondary | ICD-10-CM | POA: Insufficient documentation

## 2023-09-07 DIAGNOSIS — Y9301 Activity, walking, marching and hiking: Secondary | ICD-10-CM | POA: Diagnosis not present

## 2023-09-07 NOTE — ED Triage Notes (Signed)
Pt presents to ED from home with daughter, pt lives with daughter and has hx of dementia, pt is nonverbal for the most part per daughter, pt tripped and fell due to tripping over blanket that she carries around all the time. Pt has bruising and small knot to right eye. Pt nose started bleeding after fall for a few minutes, no active bleeding at this time. Pt does not appear in pain, but unable to state pain level.

## 2023-09-08 ENCOUNTER — Emergency Department (HOSPITAL_COMMUNITY): Payer: Medicare Other

## 2023-09-08 ENCOUNTER — Emergency Department (HOSPITAL_COMMUNITY)
Admission: EM | Admit: 2023-09-08 | Discharge: 2023-09-08 | Disposition: A | Discharge status: Home / Self Care | Payer: Medicare Other | Attending: Emergency Medicine | Admitting: Emergency Medicine

## 2023-09-08 DIAGNOSIS — S161XXA Strain of muscle, fascia and tendon at neck level, initial encounter: Secondary | ICD-10-CM

## 2023-09-08 DIAGNOSIS — S0291XA Unspecified fracture of skull, initial encounter for closed fracture: Secondary | ICD-10-CM

## 2023-09-08 DIAGNOSIS — S0990XA Unspecified injury of head, initial encounter: Secondary | ICD-10-CM

## 2023-09-08 DIAGNOSIS — S0511XA Contusion of eyeball and orbital tissues, right eye, initial encounter: Secondary | ICD-10-CM | POA: Diagnosis not present

## 2023-09-08 DIAGNOSIS — S0240EA Zygomatic fracture, right side, initial encounter for closed fracture: Secondary | ICD-10-CM | POA: Diagnosis not present

## 2023-09-08 DIAGNOSIS — S0240CA Maxillary fracture, right side, initial encounter for closed fracture: Secondary | ICD-10-CM | POA: Diagnosis not present

## 2023-09-08 DIAGNOSIS — M47812 Spondylosis without myelopathy or radiculopathy, cervical region: Secondary | ICD-10-CM | POA: Diagnosis not present

## 2023-09-08 DIAGNOSIS — W19XXXA Unspecified fall, initial encounter: Secondary | ICD-10-CM

## 2023-09-08 HISTORY — DX: Unspecified dementia, unspecified severity, without behavioral disturbance, psychotic disturbance, mood disturbance, and anxiety: F03.90

## 2023-09-08 MED ORDER — CEPHALEXIN 500 MG PO CAPS: 500.0000 mg | ORAL_CAPSULE | Freq: Three times a day (TID) | ORAL | 0 refills | Status: DC

## 2023-09-08 MED ORDER — ACETAMINOPHEN 325 MG PO TABS
650.0000 mg | ORAL_TABLET | Freq: Once | ORAL | Status: AC
  Administered 2023-09-08: 650 mg via ORAL
  Filled 2023-09-08: qty 2

## 2023-09-08 MED ORDER — CEPHALEXIN 500 MG PO CAPS
500.0000 mg | ORAL_CAPSULE | Freq: Once | ORAL | Status: AC
  Administered 2023-09-08: 500 mg via ORAL
  Filled 2023-09-08: qty 1

## 2023-09-08 NOTE — Discharge Instructions (Signed)
Follow-up with [facility] ENT in the next 3 to 4 days.  The contact information for Dr. Pollyann Kennedy has been provided in this discharge summary for you to call on Monday and make these arrangements.  Ice for 20 minutes every 2 hours while awake for the next 2 days.  Return to the ER if you experience any new and/or concerning issues.

## 2023-09-08 NOTE — ED Provider Notes (Signed)
[place] EMERGENCY DEPARTMENT AT [facility] Provider Note   CSN: [id number] Arrival date & time: 09/07/23  2029     History  Chief Complaint  Patient presents with   Marletta Lor    Laura Young is a 88 y.o. female.  Patient is an 88 year old female with history of dementia, hypothyroidism.  Patient brought by her daughter for evaluation of a fall.  She was walking through the living room carrying a blanket when the daughter believes she got tangled up in the blanket and fell forward.  She had bleeding from her nose and swelling above her right thigh.  There was no loss of consciousness, but patient is nonverbal and adds no additional history.  The history is provided by the patient.       Home Medications Prior to Admission medications   Medication Sig Start Date End Date Taking? Authorizing Provider  methimazole (TAPAZOLE) 5 MG tablet Take 5 mg by mouth 2 (two) times a week. Tuesdays and Saturdays    [provider]  Vitamin D, Ergocalciferol, (DRISDOL) 1.25 MG (50000 UNIT) CAPS capsule Take 50,000 Units by mouth every Tuesday. 01/03/22   [provider]      Allergies    Patient has no known allergies.    Review of Systems   Review of Systems  All other systems reviewed and are negative.   Physical Exam Updated Vital Signs BP 121/79 (BP Location: Right Arm)   Pulse 69   Temp 98.4 F (36.9 C) (Oral)   Resp 17   Ht 5\' 1"  (1.549 m)   Wt 41.7 kg   SpO2 100%   BMI 17.37 kg/m  Physical Exam Vitals and nursing note reviewed.  Constitutional:      General: She is not in acute distress.    Appearance: She is well-developed. She is not diaphoretic.  HENT:     Head: Normocephalic.     Comments: There is bruising and ecchymosis noted to the supraorbital area on the right and right cheekbone. Eyes:     Extraocular Movements: Extraocular movements intact.     Pupils: Pupils are equal, round, and reactive to light.  Cardiovascular:      Rate and Rhythm: Normal rate and regular rhythm.     Heart sounds: No murmur heard.    No friction rub. No gallop.  Pulmonary:     Effort: Pulmonary effort is normal. No respiratory distress.     Breath sounds: Normal breath sounds. No wheezing.  Abdominal:     General: Bowel sounds are normal. There is no distension.     Palpations: Abdomen is soft.     Tenderness: There is no abdominal tenderness.  Musculoskeletal:        General: Normal range of motion.     Cervical back: Normal range of motion and neck supple.  Skin:    General: Skin is warm and dry.  Neurological:     General: No focal deficit present.     Mental Status: She is alert and oriented to person, place, and time.     Cranial Nerves: No cranial nerve deficit.     Motor: No weakness.     Coordination: Coordination normal.     ED Results / Procedures / Treatments   Labs (all labs ordered are listed, but only abnormal results are displayed) Labs Reviewed - No data to display  EKG None  Radiology No results found.  Procedures Procedures    Medications Ordered in ED Medications -  No data to display  ED Course/ Medical Decision Making/ A&P  Patient brought by her daughter for evaluation of a fall.  She has facial injuries after tripping on a blanket  Patient with history of dementia and is nonverbal.  She arrives with stable vital signs and is afebrile.  There is some bruising noted to the right cheekbone and supraorbital ridge, but appears neurologically intact otherwise.  Imaging studies obtained including CT scan of the head, CT scan of the cervical spine, and CT scan maxillofacial.  She does have fractures of the right zygomatic arch, the anterior and lateral walls of the right maxillary sinus, and minimally displaced fracture of the right lateral orbital wall.  Cervical spine and head are otherwise unremarkable.  Patient will be discharged with ENT follow-up.  I will initiate antibiotics due to the  bleeding from the nose and the maxillary sinus fracture.  Final Clinical Impression(s) / ED Diagnoses Final diagnoses:  None    Rx / DC Orders ED Discharge Orders     None         Geoffery Lyons, MD 09/08/23 [phone]

## 2023-09-10 DIAGNOSIS — S0240CD Maxillary fracture, right side, subsequent encounter for fracture with routine healing: Secondary | ICD-10-CM | POA: Diagnosis not present

## 2023-11-08 IMAGING — US US THYROID
1 series · 13 of 25 positions shown · non-contrast
Comparison: 4517

CLINICAL DATA: Prior ultrasound follow-up.

EXAM:
THYROID ULTRASOUND
TECHNIQUE: Ultrasound examination of the thyroid gland and adjacent soft
tissues was performed.

[Series 1: us thyroid · 0.10mm/px · 69 acquisitions, 13 frames shown]
[im 1/69]
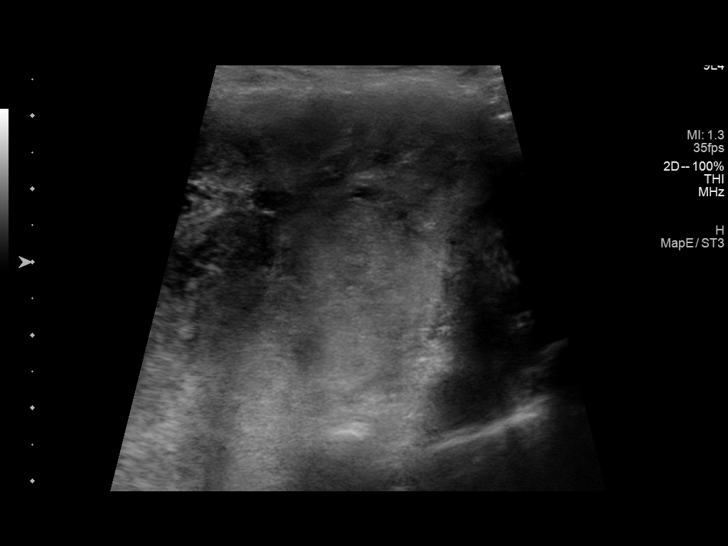
[im 6/69]
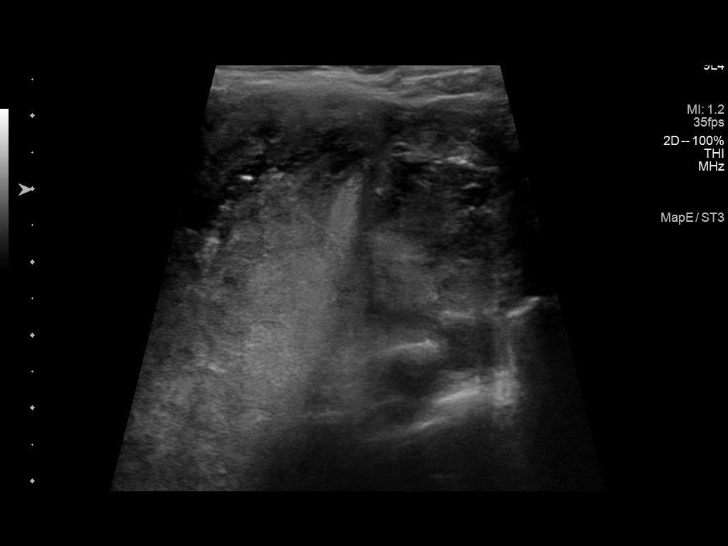
[im 12/69]
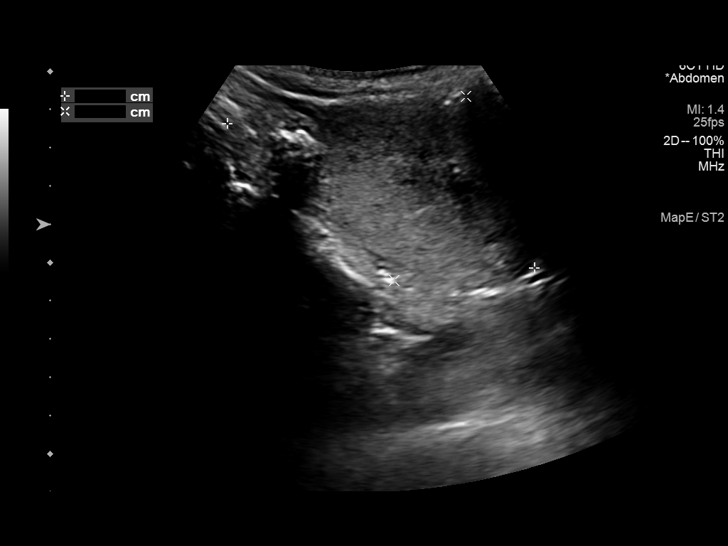
[im 18/69]
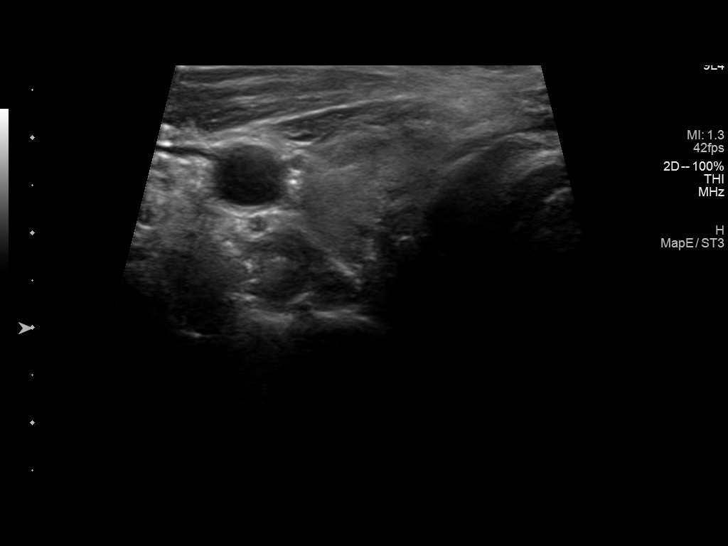
[im 23/69]
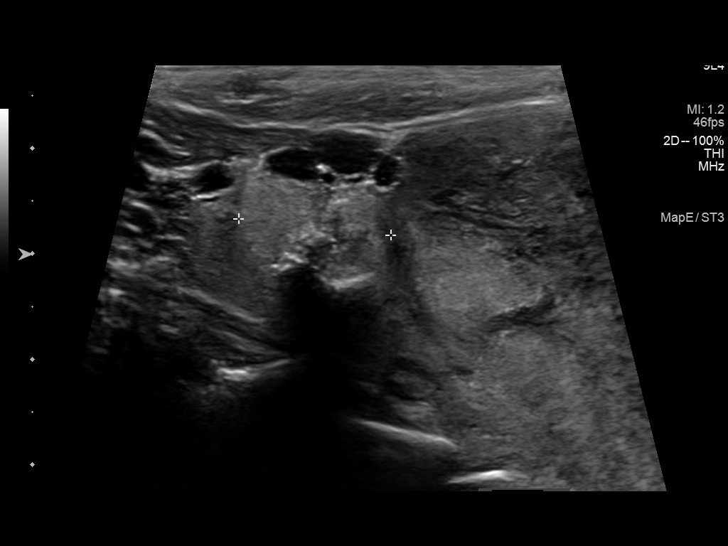
[im 29/69]
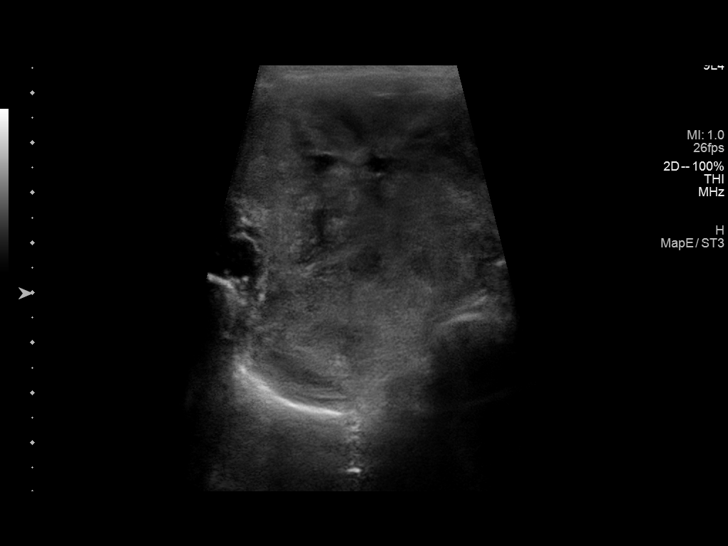
[im 35/69]
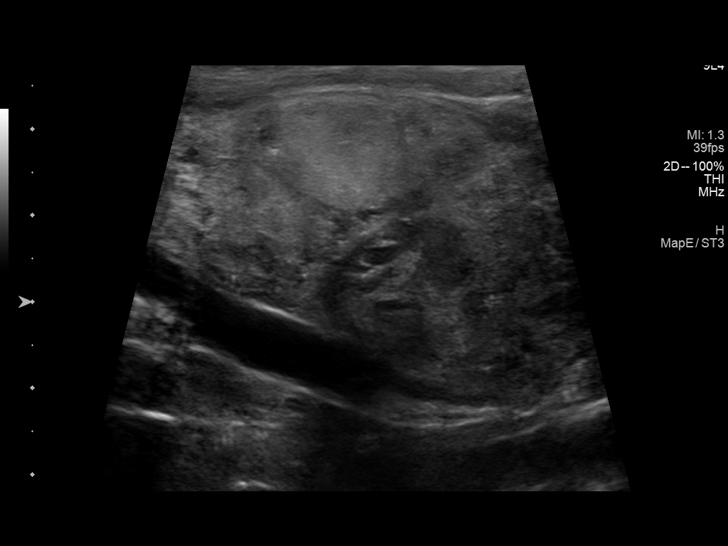
[im 40/69]
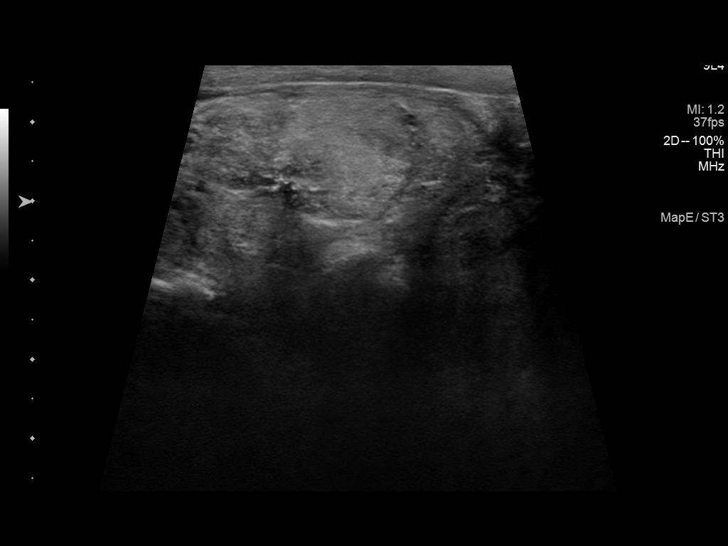
[im 46/69]
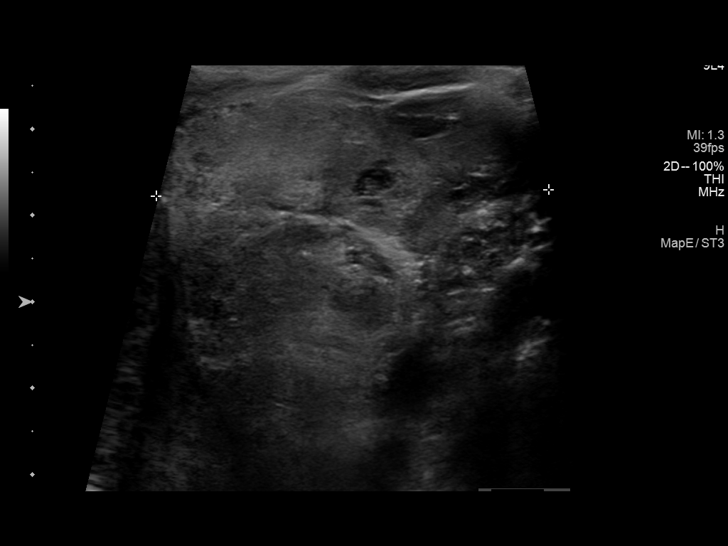
[im 52/69]
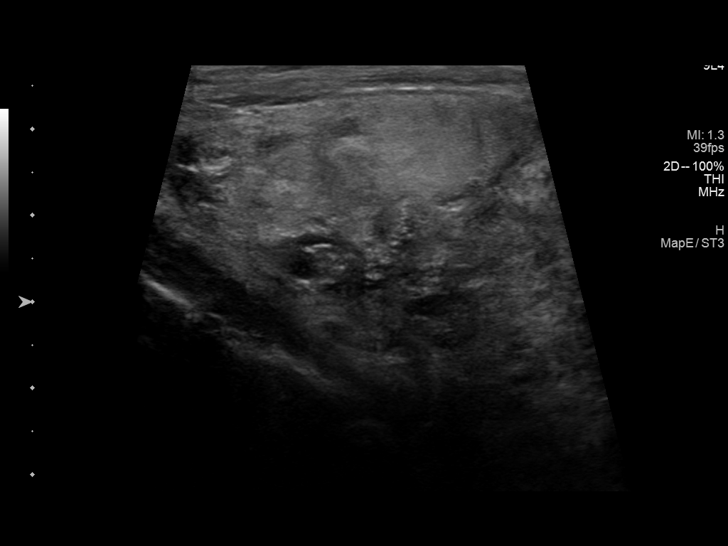
[im 57/69]
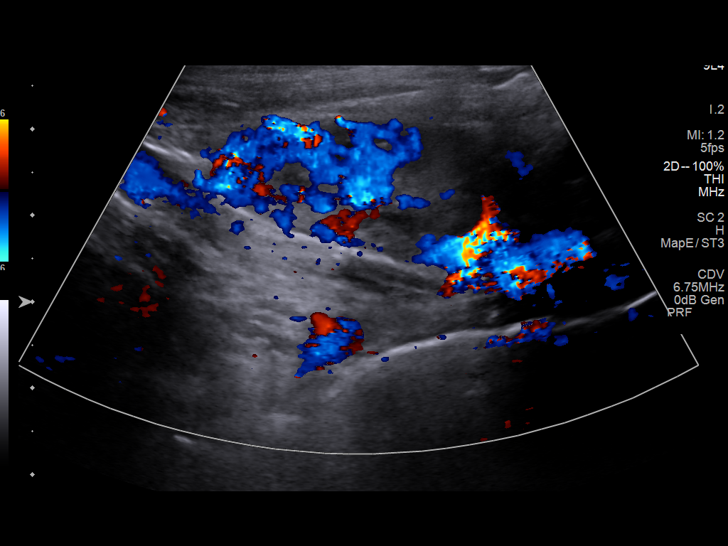
[im 63/69]
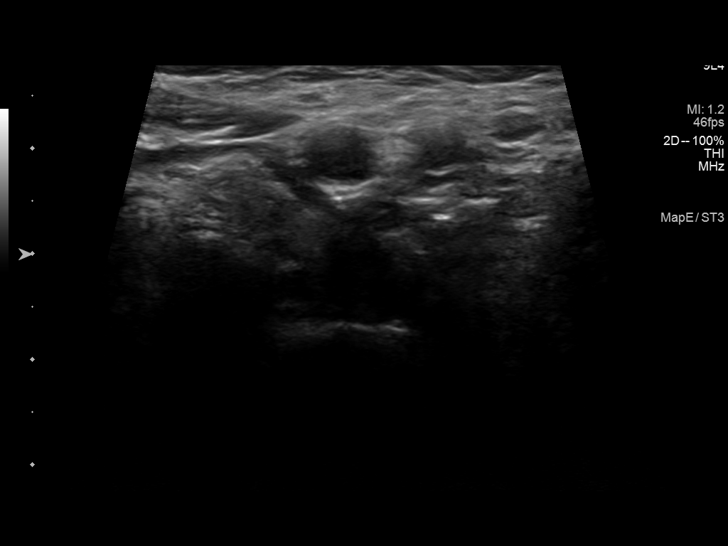
[im 69/69]
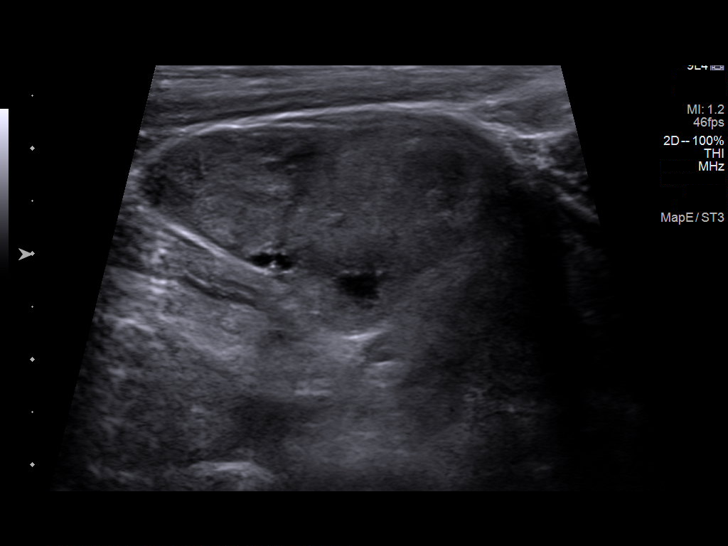

[13 of 25 positions shown; findings below may reference images not displayed]

FINDINGS: Parenchymal Echotexture: Markedly heterogenous

Isthmus: 4.6 cm

Right lobe: 8.9 x 5.2 x 6.1 cm

Left lobe: 7.1 x 3.6 x 4.6 cm

_________________________________________________________

Estimated total number of nodules >/= 1 cm: 2

Number of spongiform nodules >/=  2 cm not described below (TR1): 0

Number of mixed cystic and solid nodules >/= 1.5 cm not described
below (TR2): 0

_________________________________________________________

Nodule labeled 1 is a solid isoechoic nodule with macro
calcification (TR 4) in the superior right thyroid lobe measuring
1.9 x 1.5 x 1.0 cm, previously 2.2 cm in 4517. Given similar to
slight decrease in size over a period of greater than 5 years, this
nodule is considered benign. No further follow-up or biopsy
recommended.

Nodule labeled 2 is a large solid isoechoic TR 3 nodule in the mid
to inferior right thyroid lobe measuring 7.1 x 6.7 x 5.8 cm,
previously 6.3 cm in 4517. This nodule has demonstrated slow
interval growth. **Given size (>/= 2.5 cm) and appearance, fine
needle aspiration of this mildly suspicious nodule should be
considered based on TI-RADS criteria.

Markedly heterogeneous left thyroid lobe makes identification of
discrete nodules challenging. At least 1 solid isoechoic TR 3 nodule
measuring 2.8 x 2.5 cm is identified (see series 5, image 207).
**Given size (>/= 2.5 cm) and appearance, fine needle aspiration of
this mildly suspicious nodule should be considered based on TI-RADS
criteria.
IMPRESSION: 1. Enlarged markedly heterogeneous multinodular thyroid gland.
2. Bilateral thyroid nodules as described meet criteria for biopsy.
Note that marked heterogeneity of the left thyroid lobe makes
identification of discrete thyroid nodules challenging.

The above is in keeping with the ACR TI-RADS recommendations - [HOSPITAL] 4893;[DATE].

## 2023-11-28 IMAGING — US US FNA BIOPSY THYROID 1ST LESION
1 series · 13 of 22 positions shown · non-contrast
Comparison: January 2022

INDICATION: Indeterminate bilateral thyroid nodules

EXAM:
Ultrasound-guided fine-needle aspiration of right thyroid nodule
Ultrasound-guided fine-needle aspiration of left thyroid nodule
TECHNIQUE: Informed written consent was obtained from the patient after a
discussion of the risks, benefits and alternatives to treatment.
Questions regarding the procedure were encouraged and answered. A
timeout was performed prior to the initiation of the procedure.

[Series 1: us fna bx thyroid 1st lesion afirma · 22 acquisitions, 13 frames shown]
[im 1/22]
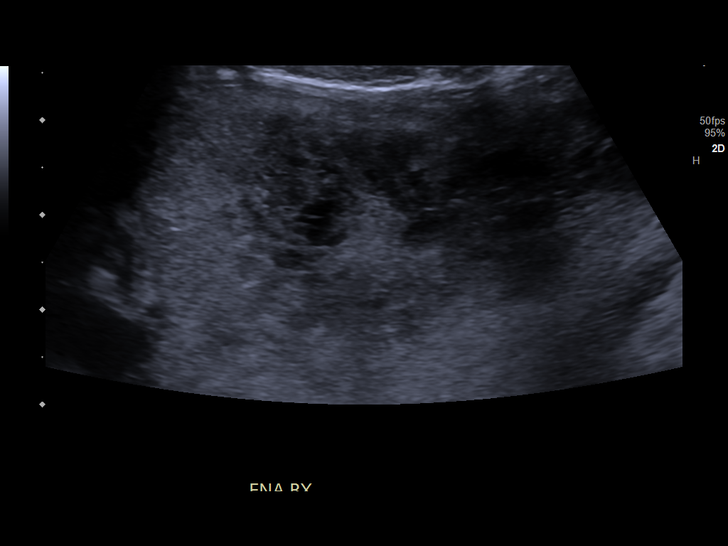
[im 3/22]
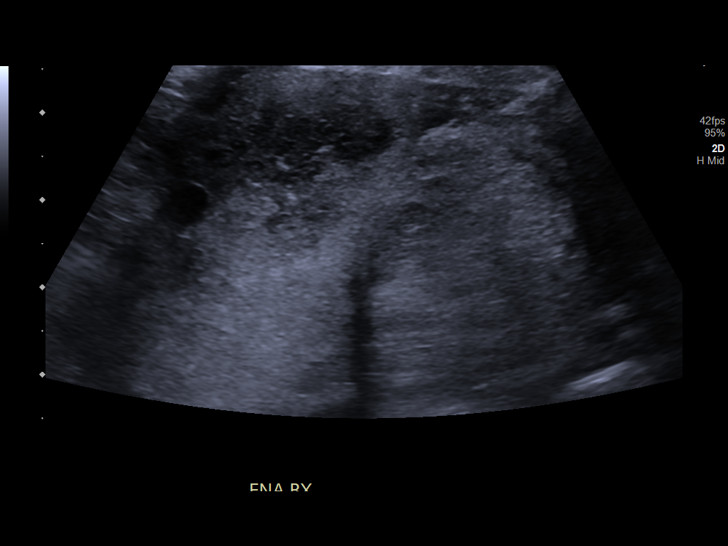
[im 5/22]
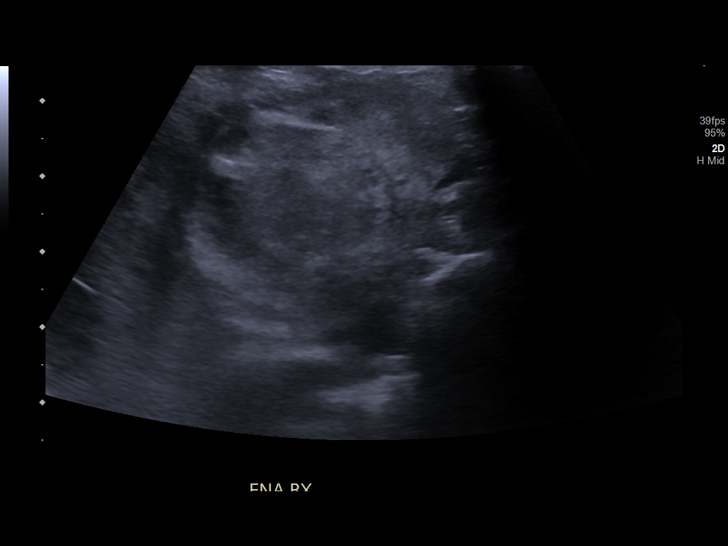
[im 6/22]
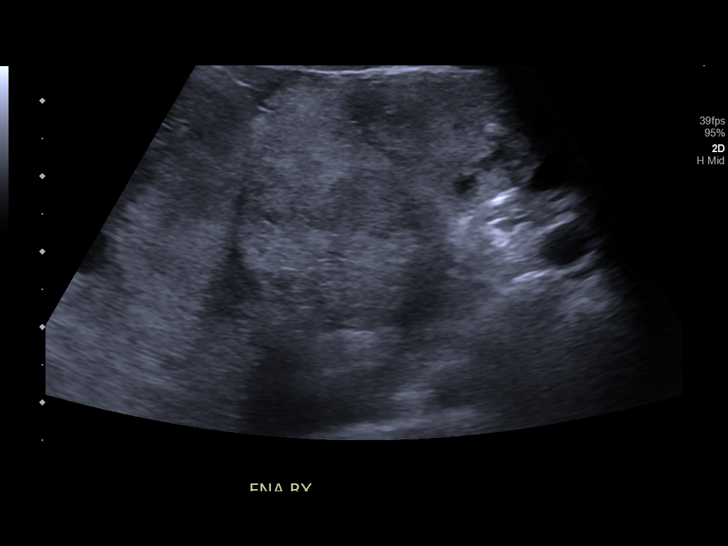
[im 8/22]
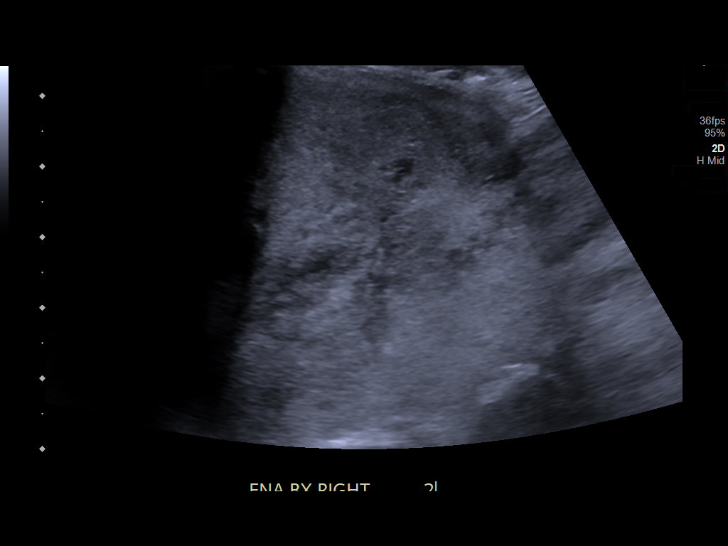
[im 10/22]
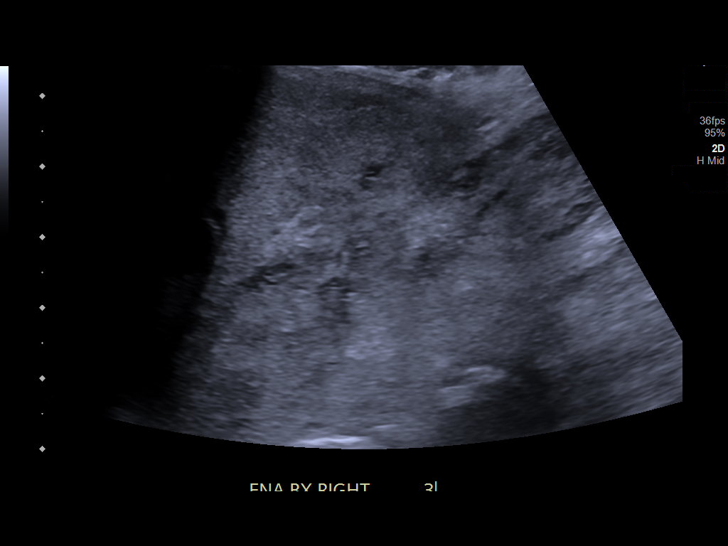
[im 12/22]
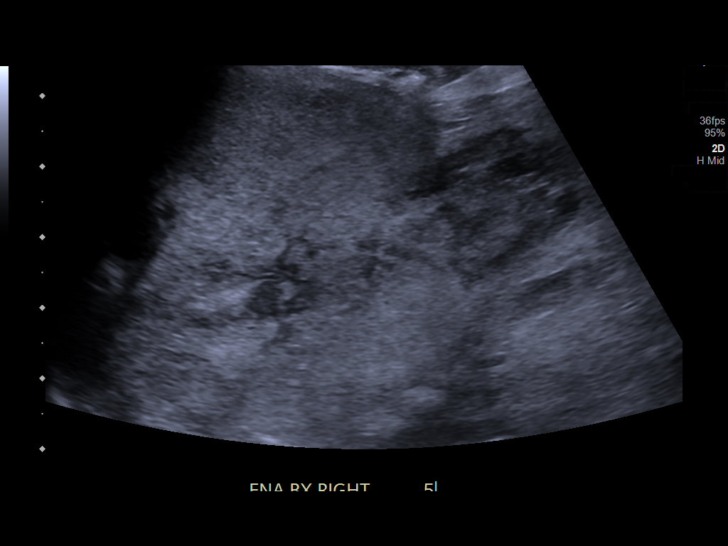
[im 13/22]
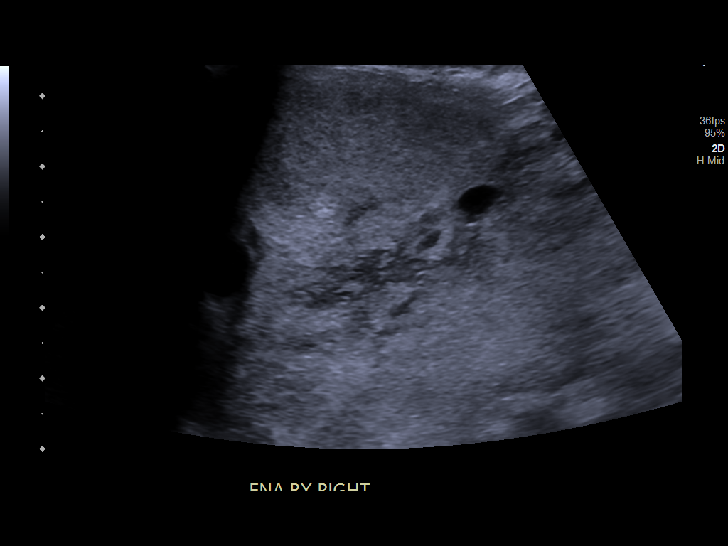
[im 15/22]
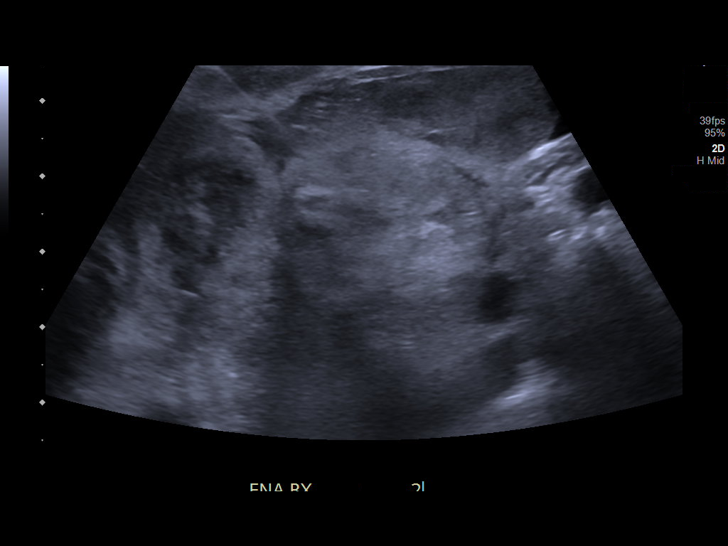
[im 17/22]
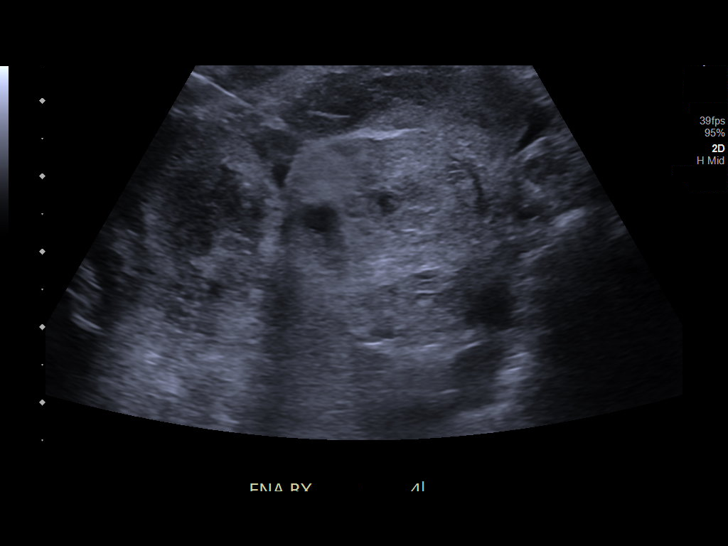
[im 18/22]
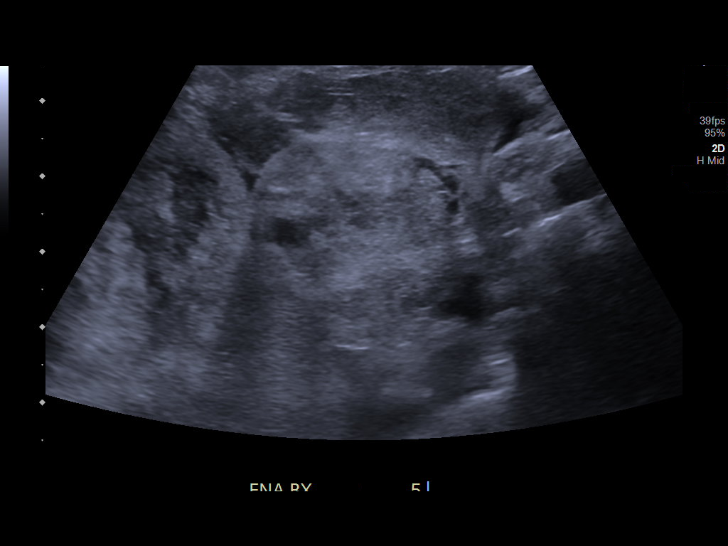
[im 20/22]
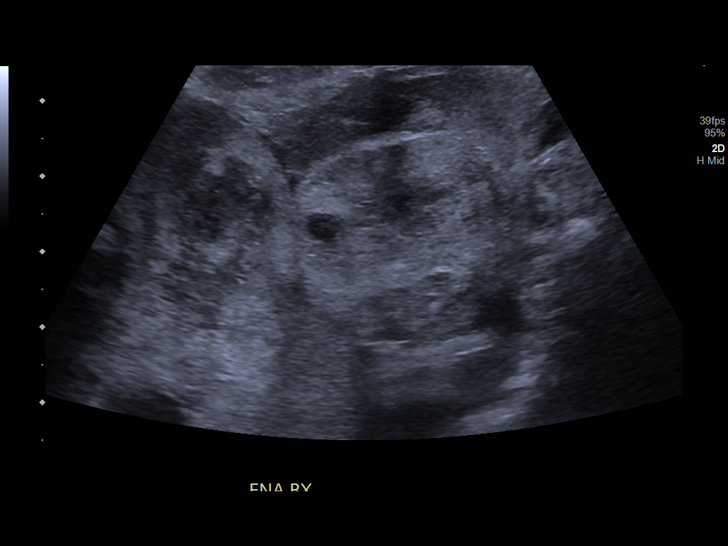
[im 22/22]
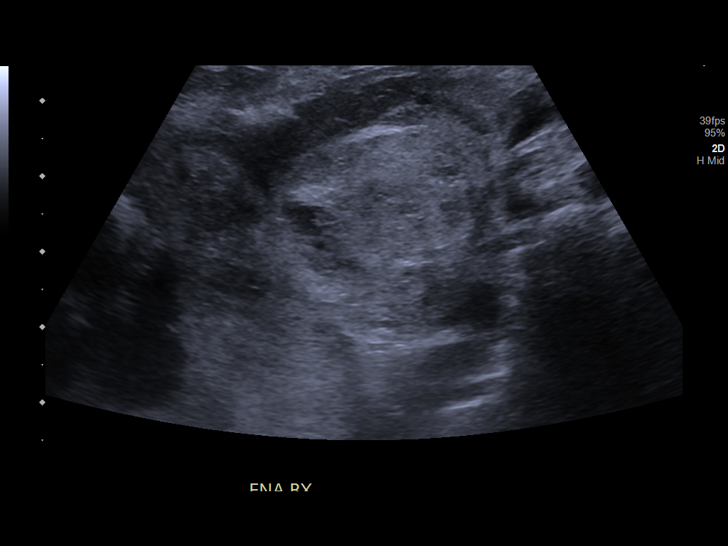

[13 of 22 positions shown; findings below may reference images not displayed]

MEDICATIONS:
None

ANESTHESIA/SEDATION:
Moderate (conscious) sedation was employed during this procedure. A
total of Versed 1 mg and Fentanyl 25 mcg was administered
intravenously.

Moderate Sedation Time: 19 minutes. The patient's level of
consciousness and vital signs were monitored continuously by
radiology nursing throughout the procedure under my direct
supervision.

COMPLICATIONS:
None immediate.
Pre-procedural ultrasound scanning demonstrated unchanged size and
appearance of the indeterminate nodules within the right and left
thyroid lobes.

The procedure was planned. The neck was prepped in the usual sterile
fashion, and a sterile drape was applied covering the operative
field. A timeout was performed prior to the initiation of the
procedure. Local anesthesia was provided with 1% lidocaine.

Under direct ultrasound guidance, x5 FNA biopsies were performed of
the right thyroid lobe nodule with a 25 gauge needle. Multiple
ultrasound images were saved for procedural documentation purposes.
The samples were prepared and submitted to pathology.

Under direct ultrasound guidance, x5 FNA biopsies were performed of
the left thyroid lobe nodule with a 25 gauge needle. Multiple
ultrasound images were saved for procedural documentation purposes.
The samples were prepared and submitted to pathology.

Limited post procedural scanning was negative for hematoma or
additional complication. Dressings were placed. The patient
tolerated the above procedures procedure well without immediate
postprocedural complication.
FINDINGS: Nodule reference number based on prior diagnostic ultrasound: 2

Maximum size: 7.1 cm

Location: Right; Inferior

ACR TI-RADS risk category: TR3 (3 points)

Reason for biopsy: meets ACR TI-RADS criteria

_________________________________________________________

Nodule reference number based on prior diagnostic ultrasound: 3

Maximum size: 2.8 cm

Location: Left; Inferior

ACR TI-RADS risk category: TR3 (3 points)

Reason for biopsy: meets ACR TI-RADS criteria

Ultrasound imaging confirms appropriate placement of the needles
within the thyroid nodule.
IMPRESSION: 1. Technically successful ultrasound guided fine needle aspiration
of right thyroid lobe nodule.
2. Technically successful ultrasound guided fine needle aspiration
of left thyroid lobe nodule.

## 2023-12-10 ENCOUNTER — Emergency Department (HOSPITAL_COMMUNITY)
Admission: EM | Admit: 2023-12-10 | Discharge: 2023-12-10 | Discharge status: Against Medical Advice | Attending: Emergency Medicine | Admitting: Emergency Medicine

## 2023-12-10 ENCOUNTER — Encounter (HOSPITAL_COMMUNITY): Payer: Self-pay

## 2023-12-10 ENCOUNTER — Other Ambulatory Visit: Payer: Self-pay

## 2023-12-10 ENCOUNTER — Emergency Department (HOSPITAL_COMMUNITY)

## 2023-12-10 DIAGNOSIS — R0989 Other specified symptoms and signs involving the circulatory and respiratory systems: Secondary | ICD-10-CM | POA: Diagnosis not present

## 2023-12-10 DIAGNOSIS — Z0001 Encounter for general adult medical examination with abnormal findings: Secondary | ICD-10-CM | POA: Diagnosis not present

## 2023-12-10 DIAGNOSIS — E0590 Thyrotoxicosis, unspecified without thyrotoxic crisis or storm: Secondary | ICD-10-CM | POA: Diagnosis not present

## 2023-12-10 DIAGNOSIS — Z5321 Procedure and treatment not carried out due to patient leaving prior to being seen by health care provider: Secondary | ICD-10-CM | POA: Diagnosis not present

## 2023-12-10 DIAGNOSIS — Z681 Body mass index (BMI) 19 or less, adult: Secondary | ICD-10-CM | POA: Diagnosis not present

## 2023-12-10 DIAGNOSIS — N390 Urinary tract infection, site not specified: Secondary | ICD-10-CM | POA: Diagnosis not present

## 2023-12-10 DIAGNOSIS — E049 Nontoxic goiter, unspecified: Secondary | ICD-10-CM | POA: Diagnosis not present

## 2023-12-10 DIAGNOSIS — E559 Vitamin D deficiency, unspecified: Secondary | ICD-10-CM | POA: Diagnosis not present

## 2023-12-10 NOTE — ED Notes (Signed)
 This RN attempted to call Dr Sharyon Medicus office to get clarification on why Pt was sent to the ER, but the office refused to answer the phone.

## 2023-12-10 NOTE — ED Triage Notes (Signed)
 Pt arrived via POV with her son following an appointment earlier today at Dr Fusco's office. Per family, Pt is expected to have a Chest Xray due to posterior thoracic bruit that Dr Sherwood Gambler heard on exam today. Family present paperwork to this RN in Triage. Per family, Pt may also have a UTI and has been "hunched over more" with ambulation.

## 2023-12-10 NOTE — ED Notes (Signed)
 Patient transported to X-ray

## 2023-12-10 NOTE — ED Notes (Signed)
 Pts caregiver states that they were only sent here for a chest x ray, paper order sent by pts PCP for order of chest XR, caregiver states that pts PCP sent in ABX for potential UTI and caregiver states "I do not think it is neccessary to be seen by an EDP as we only came here for an x ray and he already prescribed her an ABX for a potential UTI" caregiver signed AMA form and verbalizes understanding of risks

## 2024-01-07 DIAGNOSIS — E0590 Thyrotoxicosis, unspecified without thyrotoxic crisis or storm: Secondary | ICD-10-CM | POA: Diagnosis not present

## 2024-01-11 DIAGNOSIS — Z0001 Encounter for general adult medical examination with abnormal findings: Secondary | ICD-10-CM | POA: Diagnosis not present

## 2024-01-11 DIAGNOSIS — Z681 Body mass index (BMI) 19 or less, adult: Secondary | ICD-10-CM | POA: Diagnosis not present

## 2024-01-16 DIAGNOSIS — E0590 Thyrotoxicosis, unspecified without thyrotoxic crisis or storm: Secondary | ICD-10-CM | POA: Diagnosis not present

## 2024-01-30 DIAGNOSIS — Z515 Encounter for palliative care: Secondary | ICD-10-CM | POA: Diagnosis not present

## 2024-03-05 DIAGNOSIS — E0590 Thyrotoxicosis, unspecified without thyrotoxic crisis or storm: Secondary | ICD-10-CM | POA: Diagnosis not present

## 2024-03-10 DIAGNOSIS — Z515 Encounter for palliative care: Secondary | ICD-10-CM | POA: Diagnosis not present

## 2024-04-08 DIAGNOSIS — Z515 Encounter for palliative care: Secondary | ICD-10-CM | POA: Diagnosis not present

## 2024-04-11 ENCOUNTER — Ambulatory Visit: Admit: 2024-04-11

## 2024-04-11 ENCOUNTER — Telehealth: Payer: Self-pay

## 2024-04-11 ENCOUNTER — Emergency Department (HOSPITAL_COMMUNITY)

## 2024-04-11 ENCOUNTER — Emergency Department (HOSPITAL_COMMUNITY)
Admission: EM | Admit: 2024-04-11 | Discharge: 2024-04-11 | Disposition: A | Discharge status: Home / Self Care | Attending: Emergency Medicine | Admitting: Emergency Medicine

## 2024-04-11 ENCOUNTER — Encounter (HOSPITAL_COMMUNITY): Payer: Self-pay

## 2024-04-11 DIAGNOSIS — R0989 Other specified symptoms and signs involving the circulatory and respiratory systems: Secondary | ICD-10-CM

## 2024-04-11 DIAGNOSIS — X58XXXA Exposure to other specified factors, initial encounter: Secondary | ICD-10-CM | POA: Insufficient documentation

## 2024-04-11 DIAGNOSIS — I700 Atherosclerosis of aorta: Secondary | ICD-10-CM | POA: Diagnosis not present

## 2024-04-11 DIAGNOSIS — T17920A Food in respiratory tract, part unspecified causing asphyxiation, initial encounter: Secondary | ICD-10-CM | POA: Insufficient documentation

## 2024-04-11 DIAGNOSIS — R918 Other nonspecific abnormal finding of lung field: Secondary | ICD-10-CM | POA: Diagnosis not present

## 2024-04-11 MED ORDER — AMOXICILLIN-POT CLAVULANATE 875-125 MG PO TABS: 1.0000 | ORAL_TABLET | Freq: Two times a day (BID) | ORAL | 0 refills | Status: AC

## 2024-04-11 NOTE — ED Triage Notes (Signed)
 Pt was having super and choked on some beans. Family had to perform the Heimlich maneuver on the pt 4-5 thrusts. Pt was able to spit out the food. Pt's family would like an x-ray to see if she aspirated. Pt has dementia and has lost the ability to communicate.

## 2024-04-11 NOTE — ED Provider Notes (Signed)
 [place] EMERGENCY DEPARTMENT AT [facility] Provider Note   CSN: [id number] Arrival date & time: 04/11/24  1906     Patient presents with: Aspiration   Laura Young is a 89 y.o. female.  She is brought in by her family who are giving the history.  Level 5 caveat secondary to dementia.  They were eating dinner together.  She was having soft food, pinto beans, fried apple.  Seem to choke on some food and started turning blue.  Was given the Heimlich maneuver and regurgitated some food and was doing better.  EMS was called and recommended to family that she come and get an x-ray.  She has had a little bit of cough since then.  No further cyanosis.  No vomiting.  She is minimally verbal at baseline.   The history is provided by a relative.       Prior to Admission medications   Medication Sig Start Date End Date Taking? Authorizing Provider  cephALEXin  (KEFLEX ) 500 MG capsule Take 1 capsule (500 mg total) by mouth 3 (three) times daily. 09/08/23   Geroldine Berg, MD  methimazole (TAPAZOLE) 5 MG tablet Take 5 mg by mouth 2 (two) times a week. Tuesdays and Saturdays    [provider]  Vitamin D, Ergocalciferol, (DRISDOL) 1.25 MG (50000 UNIT) CAPS capsule Take 50,000 Units by mouth every Tuesday. 01/03/22   [provider]    Allergies: Patient has no known allergies.    Review of Systems  Unable to perform ROS: Dementia  Respiratory:  Positive for choking.     Updated Vital Signs BP (!) 156/47 (BP Location: Right Arm)   Pulse 99   Temp 98 F (36.7 C) (Tympanic)   Resp 18   Ht 5' 1 (1.549 m)   Wt 41.7 kg   SpO2 99%   BMI 17.37 kg/m   Physical Exam Vitals and nursing note reviewed.  Constitutional:      General: She is not in acute distress.    Appearance: Normal appearance. She is well-developed.  HENT:     Head: Normocephalic and atraumatic.  Eyes:     Conjunctiva/sclera: Conjunctivae normal.  Cardiovascular:     Rate and Rhythm:  Normal rate and regular rhythm.     Heart sounds: No murmur heard. Pulmonary:     Effort: Pulmonary effort is normal. No respiratory distress.     Breath sounds: Normal breath sounds. No stridor. No wheezing.  Abdominal:     Palpations: Abdomen is soft.     Tenderness: There is no abdominal tenderness. There is no guarding or rebound.  Musculoskeletal:        General: No deformity.     Cervical back: Neck supple.  Skin:    General: Skin is warm and dry.  Neurological:     General: No focal deficit present.     Mental Status: She is alert.     GCS: GCS eye subscore is 4. GCS verbal subscore is 5. GCS motor subscore is 6.     (all labs ordered are listed, but only abnormal results are displayed) Labs Reviewed - No data to display  EKG: None  Radiology: [facility] Chest Port 1 View Result Date: 04/11/2024 EXAM: 1 VIEW XRAY OF THE CHEST 04/11/2024 07:47:00 PM COMPARISON: 12/10/2023 CLINICAL HISTORY: Choking episode. Per triage: Pt was having supper and choked on some beans. Family had to perform the Heimlich maneuver on the pt 4-5 thrusts. Pt was able to spit out the  food. Pt's family would like an x-ray to see if she aspirated. Pt has dementia and has lost the ability to communicate. FINDINGS: LUNGS AND PLEURA: Left basilar atelectasis or infiltrates. The lungs are otherwise clear. No pleural effusion or pneumothorax. HEART AND MEDIASTINUM: Stable cardiomediastinal silhouette and aortic atherosclerotic calcification. Unchanged focal density along the upper right mediastinum with mass effect upon the trachea compatible with enlarged thyroid  as seen on CT October 17, 2023. BONES AND SOFT TISSUES: No acute osseous abnormality. IMPRESSION: 1. Left basilar atelectasis or infiltrates. Electronically signed by: Norman Gatlin MD 04/11/2024 07:51 PM EDT RP Workstation: [id number]     Procedures   Medications Ordered in the ED - No data to display  Clinical Course as of 04/12/24 1033  Fri Apr 11, 2024   1952 Patient's x-ray does not show any acute infiltrate or gross foreign body.  I did discuss the limitations of an x-ray with the family members and that foreign body may not be seen, aspiration may not be apparent initially. [MB]  1954 Radiology is calling left basilar infiltrate or atelectasis.  Will cover her with an antibiotic.  Satting 99% on room air. [MB]  2007 Reviewed results of x-ray with family.  They are agreeable to starting on antibiotic.  I would like to take her home and observe for now.  I think that is reasonable at this time with her stable vitals.  She was able to swallow liquids without any difficulty. [MB]    Clinical Course User Index [MB] Towana Ozell BROCKS, MD                                 Medical Decision Making Amount and/or Complexity of Data Reviewed Radiology: ordered.  Risk Prescription drug management.   This patient complains of choking episode; this involves an extensive number of treatment Options and is a complaint that carries with it a high risk of complications and morbidity. The differential includes aspiration, foreign body, pneumonia, pneumothorax, rib injury  I ordered imaging studies which included chest x-ray and I independently    visualized and interpreted imaging which showed atelectasis versus infiltrate left base Additional history obtained from patient's family members Previous records obtained and reviewed in epic including prior PCP and ED visits Social determinants considered, no significant barriers Critical Interventions: None  After the interventions stated above, I reevaluated the patient and found patient to be satting well on room air in no distress.  Tolerating p.o. Admission and further testing considered, family is comfortable taking her home and continuing to monitor her symptoms.  Will cover her with Augmentin  for possible aspiration.  Return instructions discussed.      Final diagnoses:  Choking episode    ED  Discharge Orders          Ordered    amoxicillin -clavulanate (AUGMENTIN ) 875-125 MG tablet  Every 12 hours        04/11/24 2007               Towana Ozell BROCKS, MD 04/12/24 1035

## 2024-04-11 NOTE — ED Notes (Signed)
 The patient was able to take multiple sips of water without vomiting or coughing it up. Dr. Towana informed.

## 2024-04-11 NOTE — Discharge Instructions (Addendum)
 You were seen after a choking episode at home.  Your x-ray showed possible signs of an infection starting in your left lower lobe.  We are starting you on an antibiotic.  Please follow-up with your regular doctor and return to the emergency department if any fever or worsening shortness of breath or cough.

## 2024-04-11 NOTE — ED Notes (Signed)
 The patients daughter and son in law verbalized understanding of d/c instructions and follow up care.Pt wheeled out of ED via wheelchair.

## 2024-04-11 NOTE — Telephone Encounter (Signed)
 Pt family brought patient into the UC due to her aspirating on her soft food dinner earlier today. Pt is nonverbal with dementia. Family informed me that they called 911 and when they arrived EMS stated that her lungs did not sound clear. Family stated they opted out of EMS transport to ER and that they would just go to urgent care. Pt family wanted UC to perform a chest x ray. Spoke to provider and she stated pt should go to the ER due to high capabilities. Pt family stated she should just take the patient to her primary doctor. I informed family that the patient needs to go to a place with the most capabilities and that her doctor will likely send her there as well. Pt family verbalized understanding.

## 2024-04-15 DIAGNOSIS — E0590 Thyrotoxicosis, unspecified without thyrotoxic crisis or storm: Secondary | ICD-10-CM | POA: Diagnosis not present

## 2024-04-15 DIAGNOSIS — R0989 Other specified symptoms and signs involving the circulatory and respiratory systems: Secondary | ICD-10-CM | POA: Diagnosis not present

## 2024-04-15 DIAGNOSIS — Z8639 Personal history of other endocrine, nutritional and metabolic disease: Secondary | ICD-10-CM | POA: Diagnosis not present

## 2024-06-09 DIAGNOSIS — Z515 Encounter for palliative care: Secondary | ICD-10-CM | POA: Diagnosis not present

## 2024-07-11 ENCOUNTER — Other Ambulatory Visit: Payer: Self-pay

## 2024-07-11 ENCOUNTER — Emergency Department (HOSPITAL_COMMUNITY)
Admission: EM | Admit: 2024-07-11 | Discharge: 2024-07-11 | Disposition: A | Discharge status: Home / Self Care | Attending: Emergency Medicine | Admitting: Emergency Medicine

## 2024-07-11 ENCOUNTER — Encounter (HOSPITAL_COMMUNITY): Payer: Self-pay | Admitting: Emergency Medicine

## 2024-07-11 DIAGNOSIS — N766 Ulceration of vulva: Secondary | ICD-10-CM | POA: Diagnosis not present

## 2024-07-11 DIAGNOSIS — N899 Noninflammatory disorder of vagina, unspecified: Secondary | ICD-10-CM | POA: Diagnosis present

## 2024-07-11 DIAGNOSIS — N765 Ulceration of vagina: Secondary | ICD-10-CM

## 2024-07-11 LAB — CBC WITH DIFFERENTIAL/PLATELET
Abs Immature Granulocytes: 0.03 K/uL (ref 0.00–0.07)
Basophils Absolute: 0.1 K/uL (ref 0.0–0.1)
Basophils Relative: 1 %
Eosinophils Absolute: 0.1 K/uL (ref 0.0–0.5)
Eosinophils Relative: 1 %
HCT: 36.5 % (ref 36.0–46.0)
Hemoglobin: 11.7 g/dL — ABNORMAL LOW (ref 12.0–15.0)
Immature Granulocytes: 0 %
Lymphocytes Relative: 11 %
Lymphs Abs: 1.3 K/uL (ref 0.7–4.0)
MCH: 29.5 pg (ref 26.0–34.0)
MCHC: 32.1 g/dL (ref 30.0–36.0)
MCV: 92.2 fL (ref 80.0–100.0)
Monocytes Absolute: 1 K/uL (ref 0.1–1.0)
Monocytes Relative: 9 %
Neutro Abs: 8.9 K/uL — ABNORMAL HIGH (ref 1.7–7.7)
Neutrophils Relative %: 78 %
Platelets: 214 K/uL (ref 150–400)
RBC: 3.96 MIL/uL (ref 3.87–5.11)
RDW: 13.7 % (ref 11.5–15.5)
WBC: 11.3 K/uL — ABNORMAL HIGH (ref 4.0–10.5)
nRBC: 0 % (ref 0.0–0.2)

## 2024-07-11 LAB — URINALYSIS, ROUTINE W REFLEX MICROSCOPIC
Bilirubin Urine: NEGATIVE
Glucose, UA: NEGATIVE mg/dL
Hgb urine dipstick: NEGATIVE
Ketones, ur: NEGATIVE mg/dL
Leukocytes,Ua: NEGATIVE
Nitrite: NEGATIVE
Protein, ur: NEGATIVE mg/dL
Specific Gravity, Urine: 1.017 (ref 1.005–1.030)
pH: 6 (ref 5.0–8.0)

## 2024-07-11 LAB — BASIC METABOLIC PANEL WITH GFR
Anion gap: 12 (ref 5–15)
BUN: 17 mg/dL (ref 8–23)
CO2: 27 mmol/L (ref 22–32)
Calcium: 9.1 mg/dL (ref 8.9–10.3)
Chloride: 103 mmol/L (ref 98–111)
Creatinine, Ser: 0.72 mg/dL (ref 0.44–1.00)
GFR, Estimated: >60 mL/min (ref >60)
Glucose, Bld: 89 mg/dL (ref 70–99)
Potassium: 3.9 mmol/L (ref 3.5–5.1)
Sodium: 142 mmol/L (ref 135–145)

## 2024-07-11 NOTE — ED Provider Notes (Signed)
 [place] EMERGENCY DEPARTMENT AT [facility] Provider Note   CSN: [id number] Arrival date & time: 07/11/24  1339     Patient presents with: Abnormal Bleeding   Laura Young is a 89 y.o. female with dementia who is nonverbal.. She was bib family who gives hx. Patient has been having blood in her diapers. They are not sure if it is from her urine or vagina. She has no changes in her behavior except that normally she walks but has to be carried to the bathroom this past week. She has no fevers and has been eating and drinking normally. She is having normal bms.   HPI     Prior to Admission medications   Medication Sig Start Date End Date Taking? Authorizing Provider  amoxicillin -clavulanate (AUGMENTIN ) 875-125 MG tablet Take 1 tablet by mouth every 12 (twelve) hours. 04/11/24   Butler, Michael C, MD  methimazole (TAPAZOLE) 5 MG tablet Take 5 mg by mouth 2 (two) times a week. Tuesdays and Saturdays    [provider]  Vitamin D, Ergocalciferol, (DRISDOL) 1.25 MG (50000 UNIT) CAPS capsule Take 50,000 Units by mouth every Tuesday. 01/03/22   [provider]    Allergies: Patient has no known allergies.    Review of Systems  Updated Vital Signs BP (!) 143/59 (BP Location: Right Arm)   Pulse 81   Resp 16   Ht 5' (1.524 m)   Wt 38.6 kg   SpO2 100%   BMI 16.60 kg/m   Physical Exam Vitals and nursing note reviewed.  Constitutional:      General: She is not in acute distress.    Appearance: She is well-developed. She is not diaphoretic.  HENT:     Head: Normocephalic and atraumatic.     Right Ear: External ear normal.     Left Ear: External ear normal.     Nose: Nose normal.     Mouth/Throat:     Mouth: Mucous membranes are moist.  Eyes:     General: No scleral icterus.    Conjunctiva/sclera: Conjunctivae normal.  Cardiovascular:     Rate and Rhythm: Normal rate and regular rhythm.     Heart sounds: Normal heart sounds. No murmur heard.     No friction rub. No gallop.  Pulmonary:     Effort: Pulmonary effort is normal. No respiratory distress.     Breath sounds: Normal breath sounds.  Abdominal:     General: Bowel sounds are normal. There is no distension.     Palpations: Abdomen is soft. There is no mass.     Tenderness: There is no abdominal tenderness. There is no guarding.  Genitourinary:    Comments: I examined the patient's perineum and rectum with patient's daughter at bedside.  Digital rectal exam reveals normal tone, no blood on examining finger. I examined the patient's vulva and perineum.  She appears to have small amount of tissue breakdown and ulceration of the left labia minora consistent with likely atrophic vaginitis and some mild tissue breakdown Musculoskeletal:     Cervical back: Normal range of motion.  Skin:    General: Skin is warm and dry.  Neurological:     Mental Status: She is alert. Mental status is at baseline.  Psychiatric:        Behavior: Behavior normal.     (all labs ordered are listed, but only abnormal results are displayed) Labs Reviewed - No data to display  EKG: None  Radiology: No results found.  Procedures   Medications Ordered in the ED - No data to display                                  Medical Decision Making Amount and/or Complexity of Data Reviewed Labs: ordered.   This is an 89 year old female with end-stage dementia who presents for evaluation of blood in her diaper.  I ordered and reviewed labs.  Hemoglobin 11.7 not significantly low with normal platelets remainder of her labs are within normal limits catheterized urine is clear with no evidence of infection.  On physical exam she does have a bit of ulceration on the left labia which is consistent with a little bit of tissue breakdown and likely due to underlying atrophic vaginitis.  She does not appear to be having a significant amount of bleeding that would require any further management or treatment at  this time.  Suggest using water only for washing and barrier such as petroleum jelly and watch for signs of heavy bleeding including clots.  She appears otherwise appropriate for discharge     Final diagnoses:  None    ED Discharge Orders     None          Arloa Chroman, PA-C 07/12/24 1806    Patsey Lot, MD 07/13/24 [phone]

## 2024-07-11 NOTE — Discharge Instructions (Signed)
 The patient was evaluated in the emergency department for bleeding in her diaper.  She appears to have a little bit of tissue breakdown in the vagina.  This does not appear infected and is likely due to some atrophic vaginitis which makes the tissue of the vagina a little bit more easy to breakdown or bleed.  Continue using gentle wipes she could also just use water you can use a barrier like petroleum jelly on the external part of the vagina.  If she begins to have very heavy bleeding like passing large clots from anywhere in her groin region you may have her return for further evaluation.  She appears otherwise appropriate for outpatient follow-up with her primary care physician.

## 2024-07-11 NOTE — ED Triage Notes (Signed)
 Pt to the ED with complaints of abnormal bleeding. The pt has a history of dementia and is nonverbal.  Family states she has blood in her depends when changing but is not sure if it is vaginal or rectal.  Family states is has been going on for the past two days.
